# Patient Record
Sex: Female | Born: 1975 | Race: White | Hispanic: No | Marital: Married | Smoking: Never smoker
Health system: Southern US, Community
[De-identification: ages and names within clinical notes are randomized; demographics above are authoritative.]

## PROBLEM LIST (undated history)

## (undated) DIAGNOSIS — N200 Calculus of kidney: Secondary | ICD-10-CM

## (undated) DIAGNOSIS — K580 Irritable bowel syndrome with diarrhea: Secondary | ICD-10-CM

## (undated) DIAGNOSIS — E119 Type 2 diabetes mellitus without complications: Secondary | ICD-10-CM

## (undated) DIAGNOSIS — R10813 Right lower quadrant abdominal tenderness: Secondary | ICD-10-CM

## (undated) DIAGNOSIS — R3129 Other microscopic hematuria: Secondary | ICD-10-CM

## (undated) DIAGNOSIS — I10 Essential (primary) hypertension: Secondary | ICD-10-CM

## (undated) DIAGNOSIS — E782 Mixed hyperlipidemia: Secondary | ICD-10-CM

## (undated) HISTORY — DX: Irritable bowel syndrome with diarrhea: K58.0

## (undated) HISTORY — DX: Mixed hyperlipidemia: E78.2

## (undated) HISTORY — DX: Other microscopic hematuria: R31.29

## (undated) HISTORY — DX: Right lower quadrant abdominal tenderness: R10.813

## (undated) HISTORY — DX: Calculus of kidney: N20.0

## (undated) HISTORY — DX: Essential (primary) hypertension: I10

## (undated) HISTORY — DX: Type 2 diabetes mellitus without complications: E11.9

---

## 1997-12-13 ENCOUNTER — Emergency Department (HOSPITAL_COMMUNITY): Admission: EM | Admit: 1997-12-13 | Discharge: 1997-12-13 | Payer: Self-pay | Admitting: Emergency Medicine

## 1998-02-14 ENCOUNTER — Emergency Department (HOSPITAL_COMMUNITY): Admission: EM | Admit: 1998-02-14 | Discharge: 1998-02-14 | Payer: Self-pay | Admitting: Emergency Medicine

## 1998-05-05 ENCOUNTER — Emergency Department (HOSPITAL_COMMUNITY): Admission: EM | Admit: 1998-05-05 | Discharge: 1998-05-05 | Payer: Self-pay | Admitting: Emergency Medicine

## 1998-05-05 ENCOUNTER — Encounter: Payer: Self-pay | Admitting: Emergency Medicine

## 2000-07-11 ENCOUNTER — Emergency Department (HOSPITAL_COMMUNITY): Admission: EM | Admit: 2000-07-11 | Discharge: 2000-07-11 | Payer: Self-pay | Admitting: Emergency Medicine

## 2002-05-24 ENCOUNTER — Other Ambulatory Visit: Admission: RE | Admit: 2002-05-24 | Discharge: 2002-05-24 | Payer: Self-pay | Admitting: Gynecology

## 2002-07-29 ENCOUNTER — Encounter: Payer: Self-pay | Admitting: Urology

## 2002-07-29 ENCOUNTER — Ambulatory Visit (HOSPITAL_COMMUNITY): Admission: RE | Admit: 2002-07-29 | Discharge: 2002-07-29 | Payer: Self-pay | Admitting: Urology

## 2003-05-11 ENCOUNTER — Inpatient Hospital Stay (HOSPITAL_COMMUNITY): Admission: AD | Admit: 2003-05-11 | Discharge: 2003-05-11 | Payer: Self-pay | Admitting: Gynecology

## 2003-06-03 ENCOUNTER — Other Ambulatory Visit: Admission: RE | Admit: 2003-06-03 | Discharge: 2003-06-03 | Payer: Self-pay | Admitting: Gynecology

## 2003-09-09 ENCOUNTER — Encounter: Admission: RE | Admit: 2003-09-09 | Discharge: 2003-12-08 | Payer: Self-pay | Admitting: Gynecology

## 2003-11-04 ENCOUNTER — Inpatient Hospital Stay (HOSPITAL_COMMUNITY): Admission: AD | Admit: 2003-11-04 | Discharge: 2003-11-04 | Payer: Self-pay | Admitting: Gynecology

## 2003-12-16 ENCOUNTER — Encounter (INDEPENDENT_AMBULATORY_CARE_PROVIDER_SITE_OTHER): Payer: Self-pay | Admitting: Specialist

## 2003-12-16 ENCOUNTER — Inpatient Hospital Stay (HOSPITAL_COMMUNITY): Admission: RE | Admit: 2003-12-16 | Discharge: 2003-12-19 | Payer: Self-pay | Admitting: Gynecology

## 2004-01-30 ENCOUNTER — Other Ambulatory Visit: Admission: RE | Admit: 2004-01-30 | Discharge: 2004-01-30 | Payer: Self-pay | Admitting: Gynecology

## 2005-01-10 IMAGING — US US OB COMP LESS 14 WK
1 series · 18 of 28 positions shown · non-contrast
Comparison: none

CLINICAL DATA: Vaginal spotting at 8 weeks of pregnancy.
COMPLETE OBSTETRICAL AND TRANSVAGINAL OBSTETRICAL ULTRASOUND, 05/11/03
Transabdominal and transvaginal sonographic imaging of the pelvis demonstrates an intrauterine gestational sac containing a fetal pole and normal appearing yolk sac.  The mean fetal crown-rump length is 1.70 cm, giving an estimated gestational age of 8 weeks 2 days.  Normal fetal cardiac activity is demonstrated with a fetal heart rate of 167 beats per minute.  A 3.2 cm simple-appearing right ovarian cyst is noted.  Also noted is a 1.1 cm simple-appearing left ovarian cyst.  No free peritoneal fluid is seen.
IMPRESSION 
Single live intrauterine gestation with an estimated gestational age of 8 weeks 2 days.
No subchorionic hemorrhage demonstrated.
3.2 cm simple right ovarian cyst and 1.1 cm simple left ovarian cyst.

[Series 1: us ob comp<14 wk · 18 of 38 slices shown]
[im 1/38]
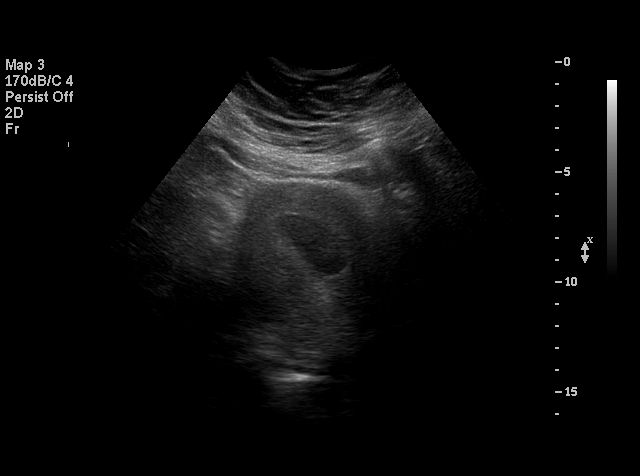
[im 3/38]
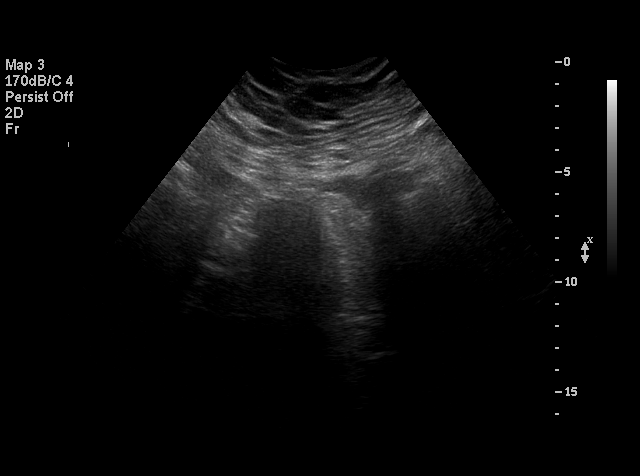
[im 5/38]
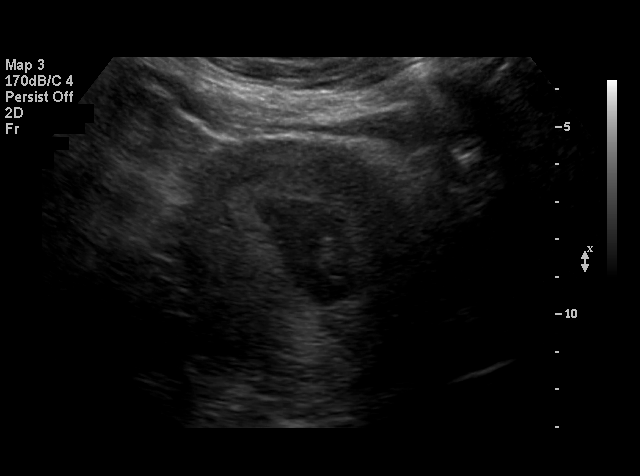
[im 7/38]
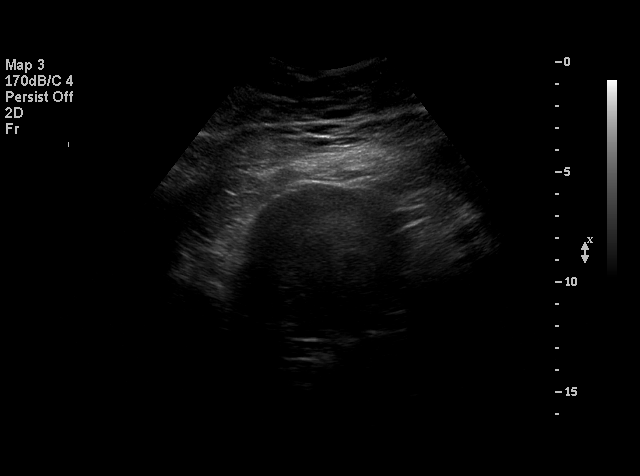
[im 10/38]
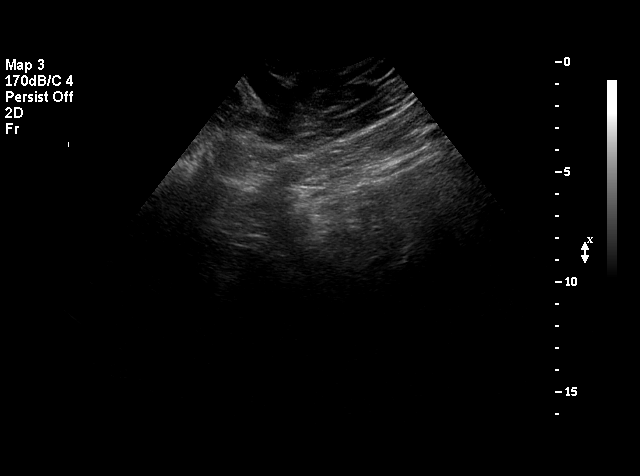
[im 11/38]
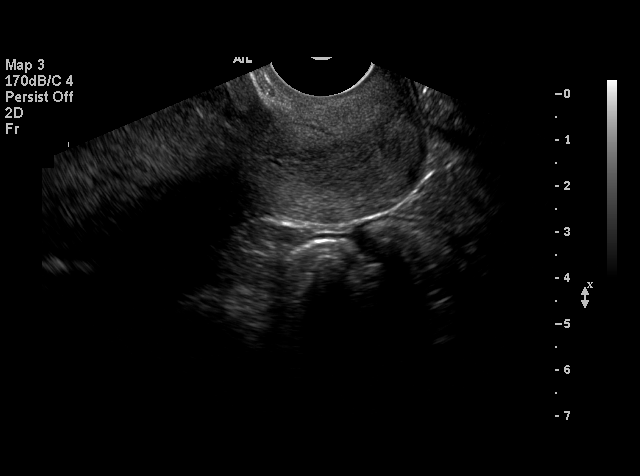
[im 14/38]
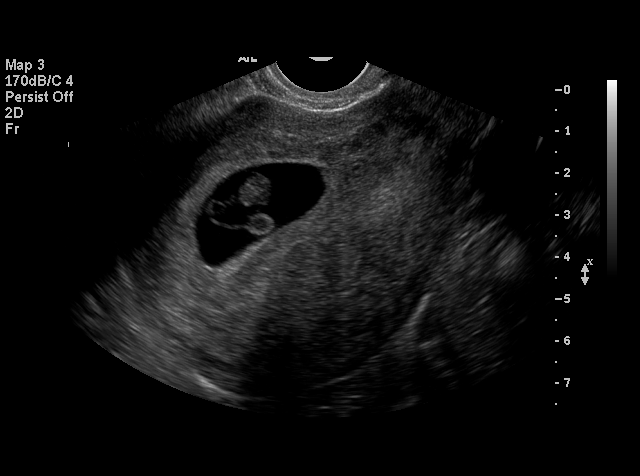
[im 16/38]
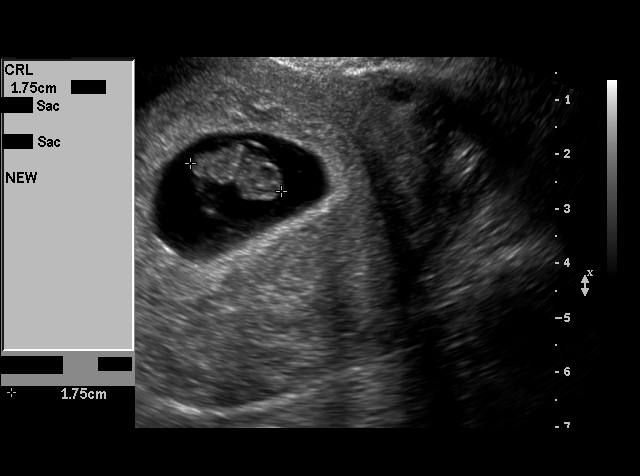
[im 18/38]
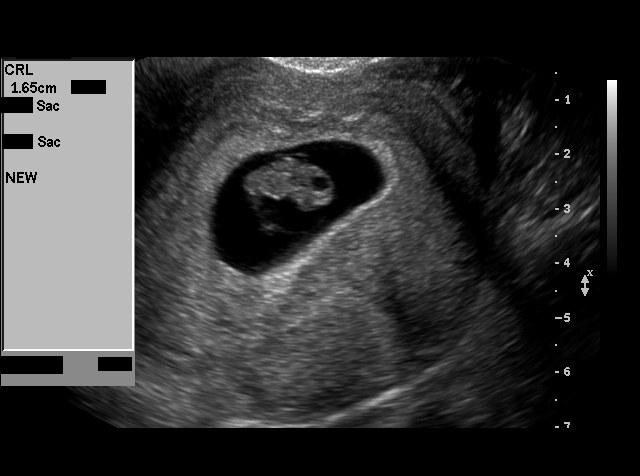
[im 20/38]
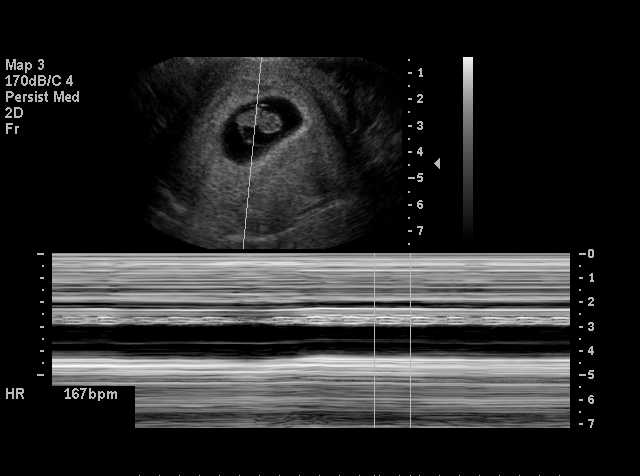
[im 22/38]
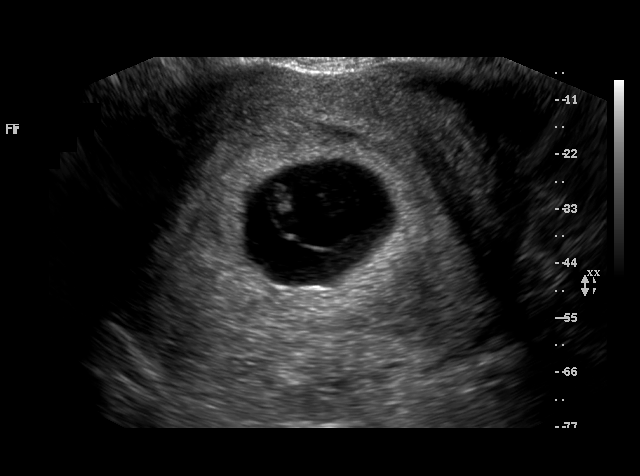
[im 24/38]
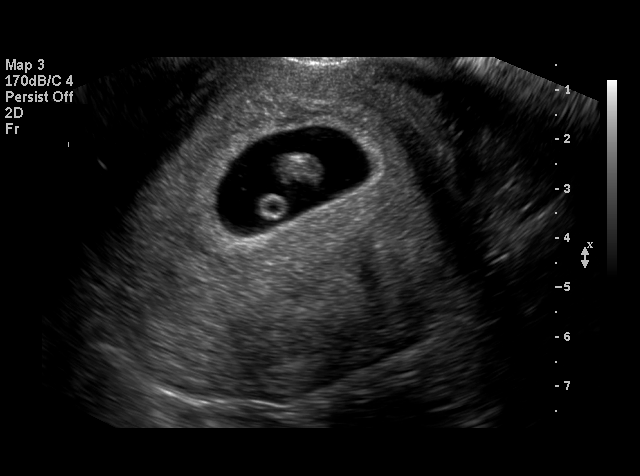
[im 27/38]
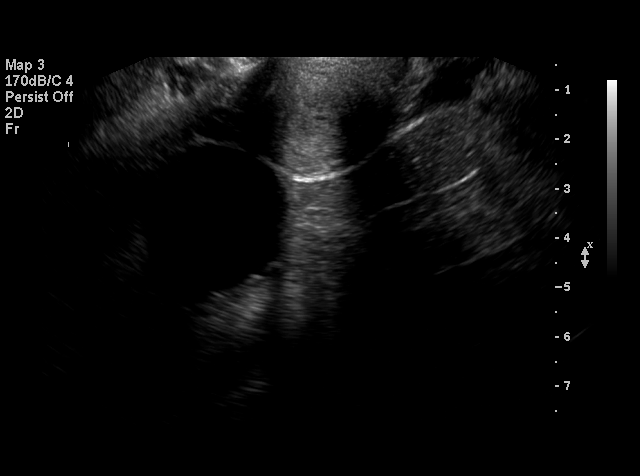
[im 29/38]
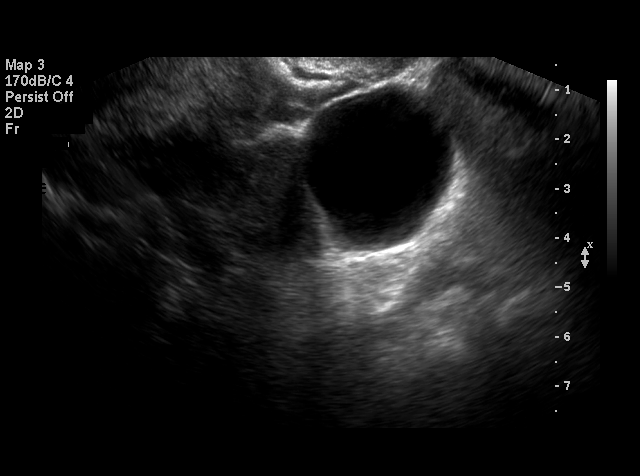
[im 31/38]
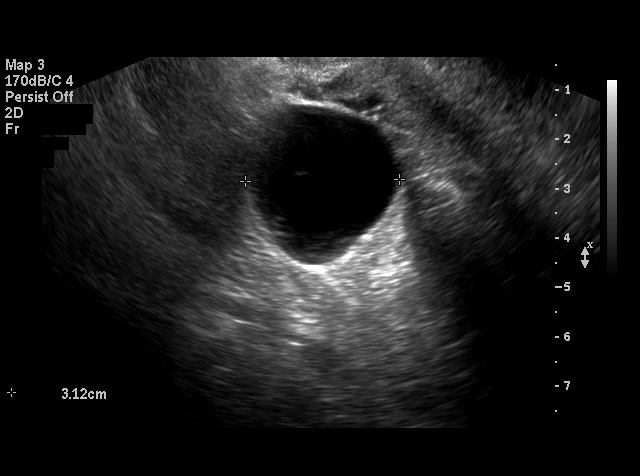
[im 33/38]
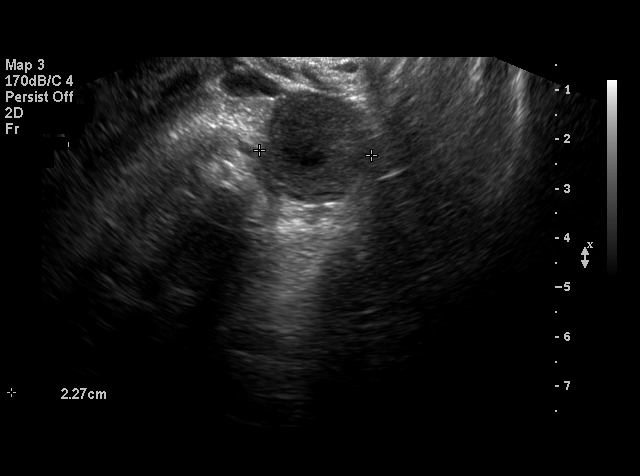
[im 35/38]
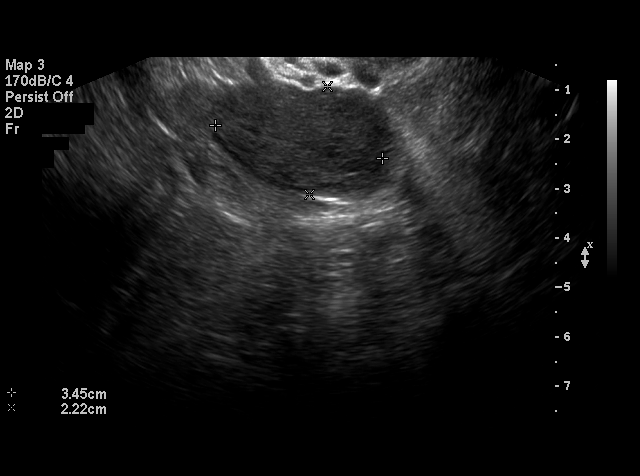
[im 38/38]
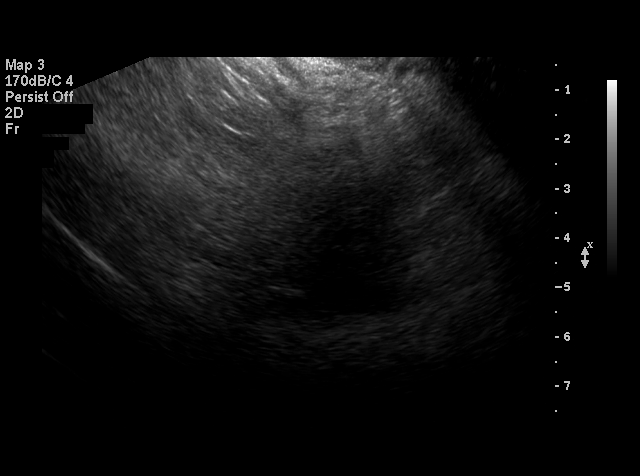

[18 of 28 positions shown; findings below may reference images not displayed]

## 2005-05-03 ENCOUNTER — Other Ambulatory Visit: Admission: RE | Admit: 2005-05-03 | Discharge: 2005-05-03 | Payer: Self-pay | Admitting: Gynecology

## 2005-07-06 IMAGING — US US RETROPERITONEAL COMPLETE
1 series · 18 of 25 positions shown · non-contrast
Comparison: None.

CLINICAL DATA: 33 weeks gestational age, history of urinary tract calculi, presenting with right flank pain. 
COMPLETE URINARY TRACT ULTRASOUND ? 11/04/2003

[Series 1: us renal/aorta · 18 of 32 slices shown]
[im 1/32]
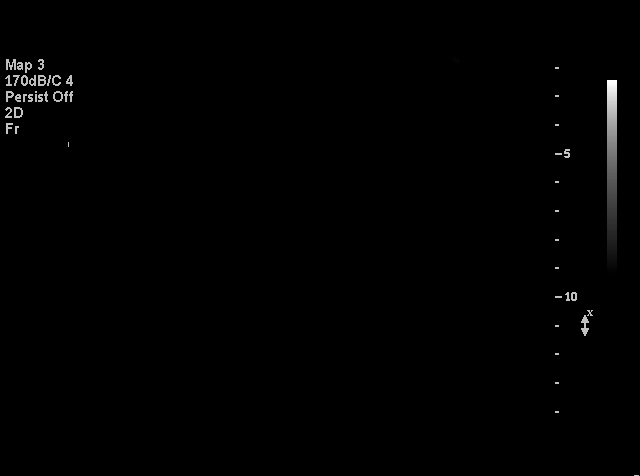
[im 3/32]
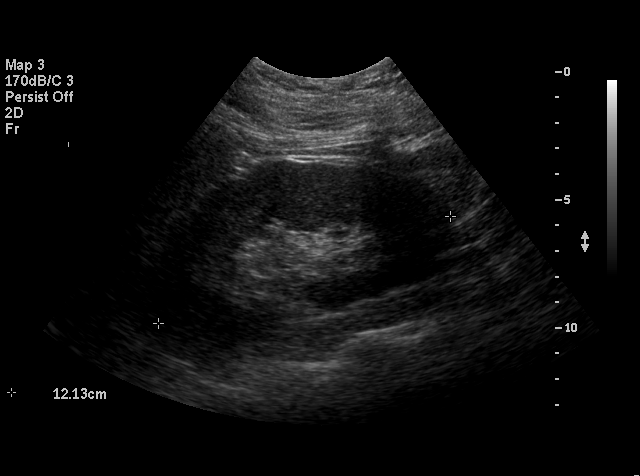
[im 4/32]
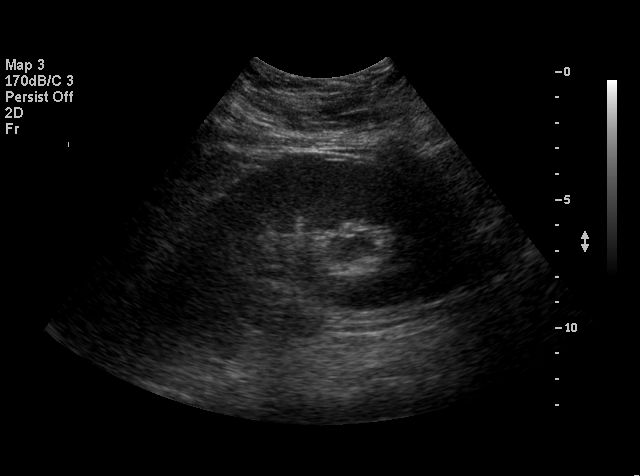
[im 6/32]
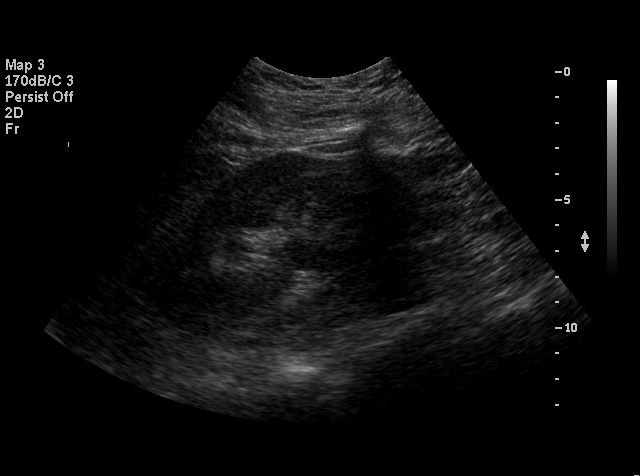
[im 8/32]
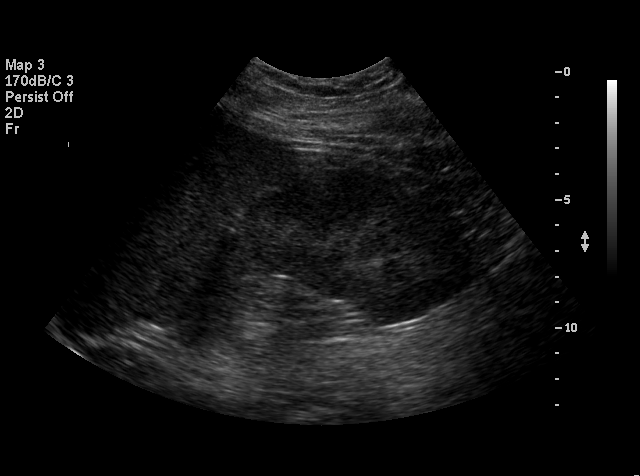
[im 10/32]
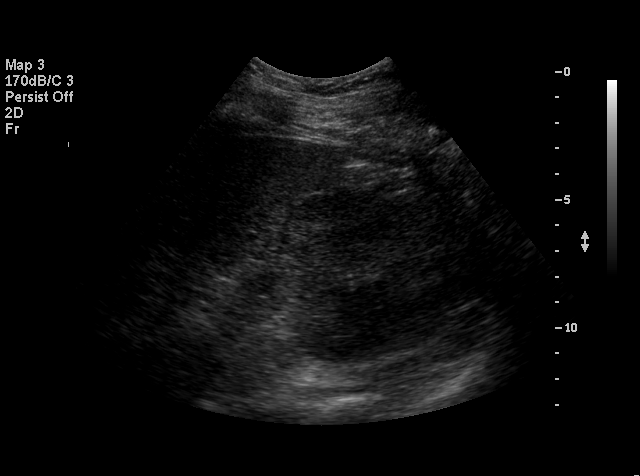
[im 12/32]
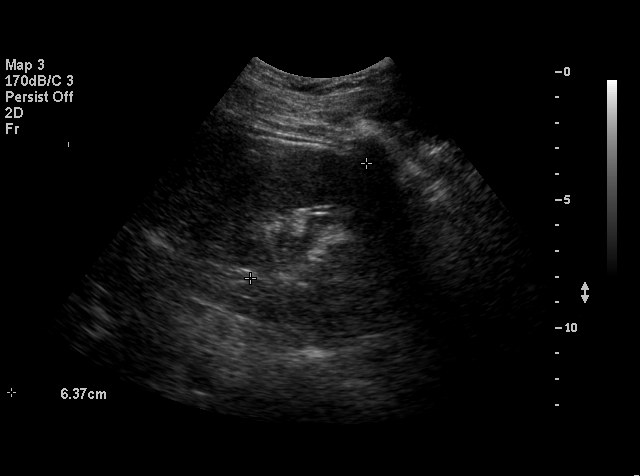
[im 13/32]
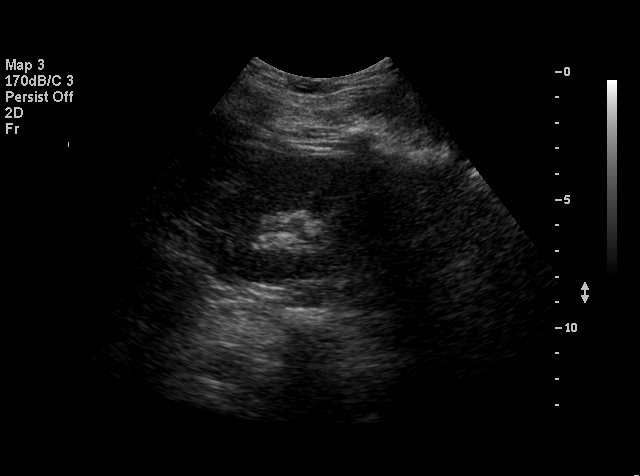
[im 15/32]
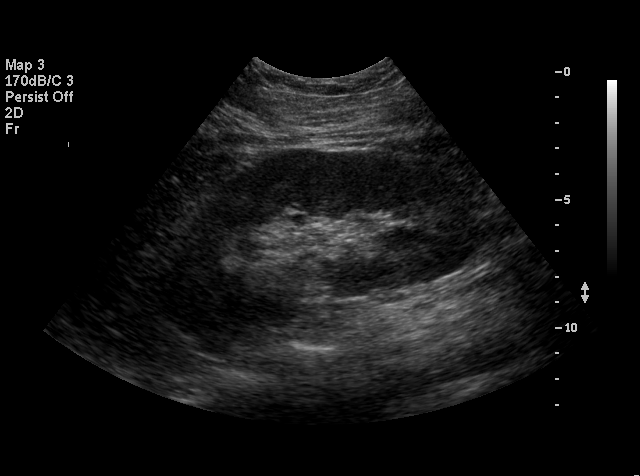
[im 17/32]
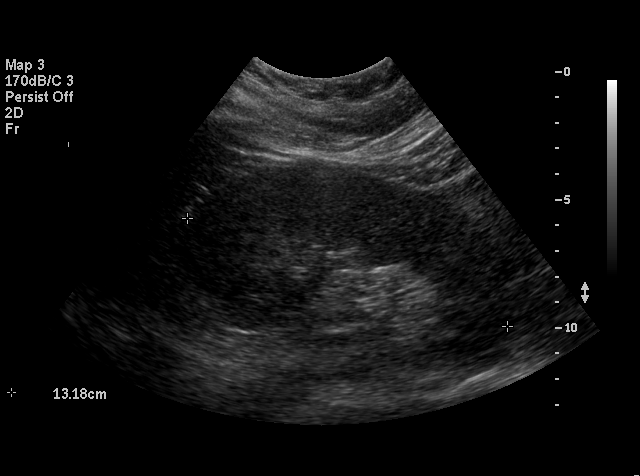
[im 19/32]
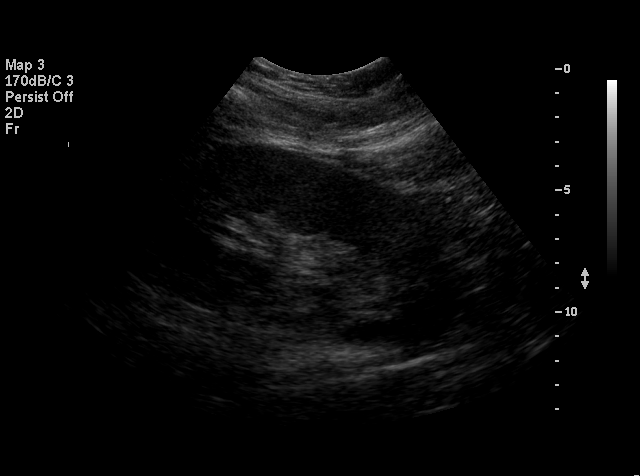
[im 20/32]
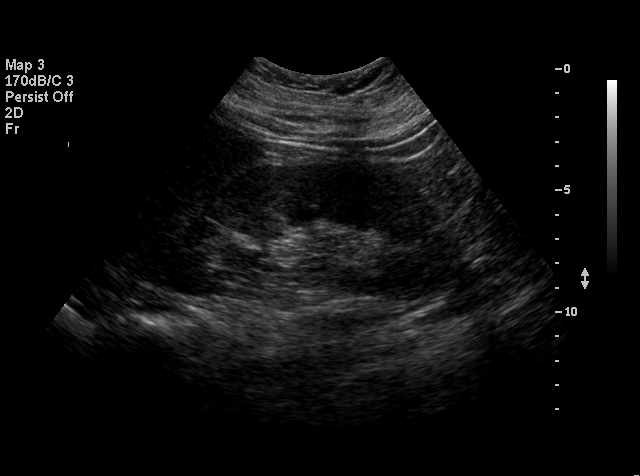
[im 22/32]
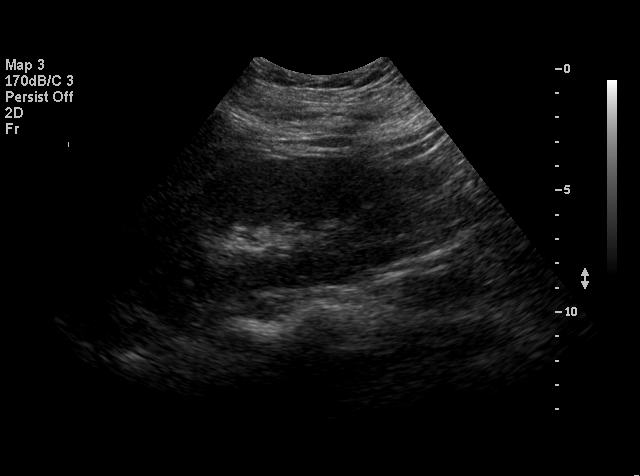
[im 24/32]
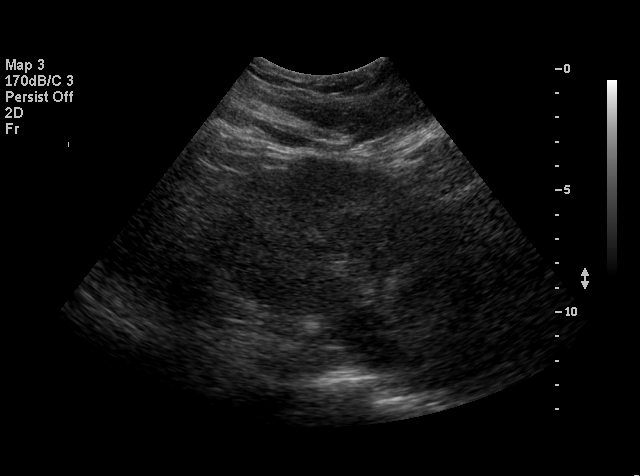
[im 26/32]
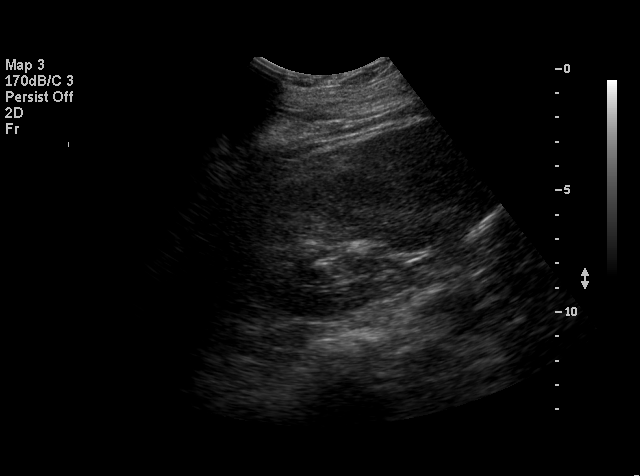
[im 28/32]
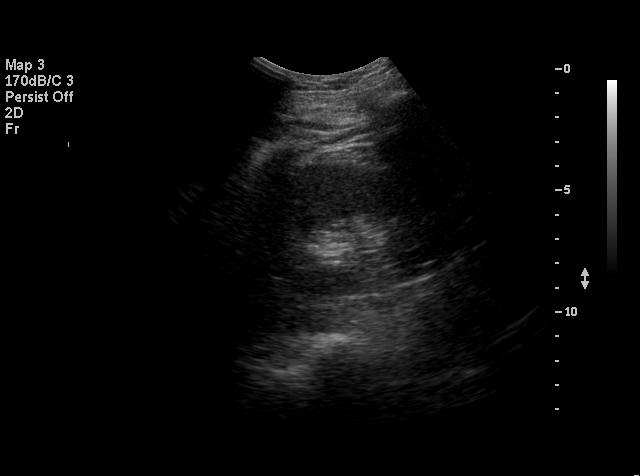
[im 29/32]
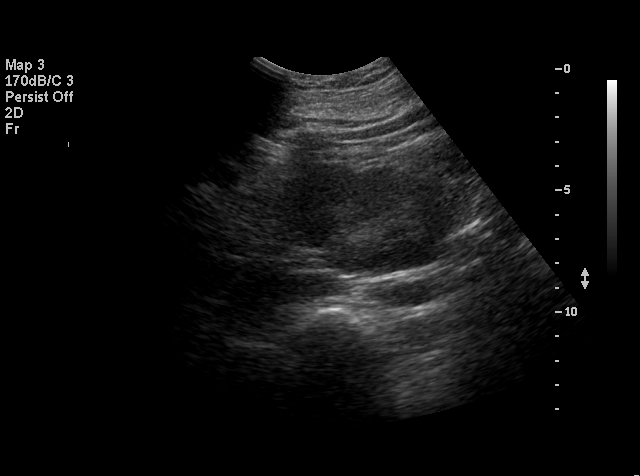
[im 32/32]
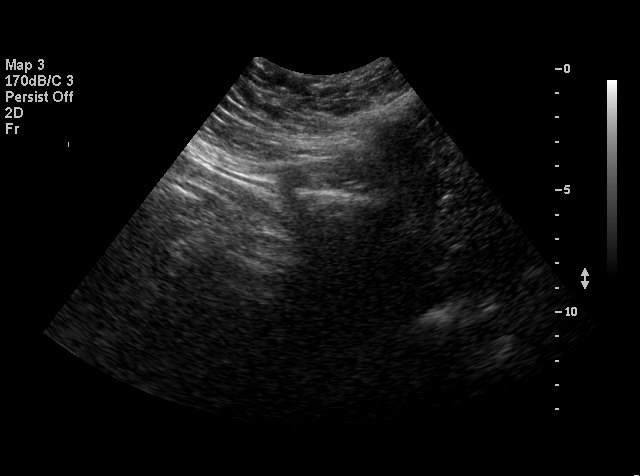

[18 of 25 positions shown; findings below may reference images not displayed]

Both kidneys are normal in size, the right kidney measuring approximately 13.2 cm and the left approximately 12.1 cm.  There is no evidence of hydronephrosis involving either kidney.  No shadowing calculi are identified.  Cortical thickness is normal bilaterally.  No focal renal parenchymal abnormalities are identified.  The bladder was emptied prior to imaging and was therefore not evaluated.  
IMPRESSION
Normal urinary tract ultrasound.

## 2006-01-24 ENCOUNTER — Other Ambulatory Visit: Admission: RE | Admit: 2006-01-24 | Discharge: 2006-01-24 | Payer: Self-pay | Admitting: Gynecology

## 2007-09-04 ENCOUNTER — Other Ambulatory Visit: Admission: RE | Admit: 2007-09-04 | Discharge: 2007-09-04 | Payer: Self-pay | Admitting: Gynecology

## 2008-01-17 ENCOUNTER — Encounter: Payer: Self-pay | Admitting: Gynecology

## 2008-01-17 ENCOUNTER — Ambulatory Visit (HOSPITAL_BASED_OUTPATIENT_CLINIC_OR_DEPARTMENT_OTHER): Admission: RE | Admit: 2008-01-17 | Discharge: 2008-01-17 | Payer: Self-pay | Admitting: Gynecology

## 2010-11-23 NOTE — H&P (Signed)
NAME:  Monique, [name]               ACCOUNT NO.:  [id number]   MEDICAL RECORD NO.:  [id number]          PATIENT TYPE:  AMB   LOCATION:  [facility]                         FACILITY:  [facility]   PHYSICIAN:  Timothy P. Fontaine, M.D.DATE OF BIRTH:  1976/06/23   DATE OF ADMISSION:  DATE OF DISCHARGE:                              HISTORY & PHYSICAL   [name] at 8:45.   CHIEF COMPLAINT:  Pelvic pain, cystic right pelvic mass.   HISTORY OF PRESENT ILLNESS:  A 35 year old G2, P2 female, oral  contraceptive birth control presents with a history of persistent pelvic  pain.  The patient notes initially on-and-off right-sided pelvic pain  which now occurs on a daily basis worsens with her menses with constant  deep dyspareunia with every coital episode.  Ultrasound does show a  persistent simple cystic mass measuring 33 mm, negative color-flow which  has persisted over the past 6 months; and she is admitted, at this time,  for laparoscopic assessment removal of this cystic area.   PAST MEDICAL HISTORY:  1. History of type 2 diabetes being followed with diet  2. Past history of hypertension, prior treatment, now on observation.   PAST SURGICAL HISTORY:  Cesarean section x2.   CURRENT MEDICATIONS:  Oral contraceptives.   REVIEW OF SYSTEMS:  Noncontributory.   FAMILY HISTORY:  Noncontributory.   SOCIAL HISTORY:  Noncontributory.   ADMISSION PHYSICAL EXAM:  VITAL SIGNS:  Afebrile vital signs are stable  with blood pressure 140/80.  HEENT: Normal.  LUNGS:  Clear.  CARDIAC:  Regular rate.  No rubs, murmurs, or gallops.  ABDOMINAL EXAM:  Benign.  PELVIC:  Bimanual without masses or tenderness.   ASSESSMENT:  A 35 year old G2, P2 female oral contraceptives persistent  right-sided pain progressively getting worse on a daily basis with deep  dyspareunia worsens with menses.  Ultrasound shows a small cystic area  in the adnexa questionable ovarian versus  paratubal, negative color-  flow, for laparoscopy and removal.  We have been following this over the  past 6 months or so.  It has remained stable in size, but her pain seems  to be progressively getting worse.  Does not have overt GI or GU  associated symptoms.  Risks, benefits, indications, and alternatives for  management were reviewed, and she wants to proceed with laparoscopy.   I reviewed the proposed surgery, the expected intraoperative  postoperative courses; to include general anesthesia, multiple port  sites, insufflation, trocar placement, use of sharp blunt dissection,  electrocautery, laser, harmonic scalpel all reviewed with her.  No  guarantees as far as pain relief were made.  She understands that her  pain may continue, worsen, or change following the procedure.  She  understands that we may find no pathology or may find pathology that I  will not be able to treat, and will leave her with pathology that would  be felt unsafe to proceed with definitive therapy at this time.   The issues of carcinoma have been discussed, understands this  possibility, and the issues of laparoscopic versus laparotomy  reviewed,  possible worsening prognosis if ruptured.  The acute risks to include  infection, both incisional as well as internal, requiring opening and  draining of incisions; closure by secondary intention; prolonged  antibiotics, all reviewed.  The risks of hemorrhage necessitating  transfusion and the risks of transfusion discussed to include  transfusion reaction, hepatitis, HIV, mad cow disease, and other unknown  entities.  The risk of inadvertent injury to internal organs either  immediately recognized or delay recognized including bowel, bladder,  ureters, vessels, and nerves necessitating major exploratory reparative  surgeries, future reparative surgeries, ostomy formation, bowel  resection, bladder repair, ureteral surgery; all discussed, understood,  and accepted.   The patient's questions were answered to her  satisfaction.  She is ready to proceed with surgery.      Timothy P. Fontaine, M.D.  Electronically Signed     TPF/MEDQ  D:  01/14/2008  T:  01/14/2008  Job:  [id number]

## 2010-11-23 NOTE — Op Note (Signed)
NAME:  Monique [name], Monique [name]               ACCOUNT NO.:  [id number]   MEDICAL RECORD NO.:  [id number]          PATIENT TYPE:  AMB   LOCATION:  [facility]                         FACILITY:  [facility]   PHYSICIAN:  Timothy P. Fontaine, M.D.DATE OF BIRTH:  [birth year]   DATE OF PROCEDURE:  01/17/2008  DATE OF DISCHARGE:                               OPERATIVE REPORT   PREOPERATIVE DIAGNOSES:  Pelvic pain, right adnexal cystic mass.   POSTOPERATIVE DIAGNOSES:  Right paratubal cyst, periumbilical adhesions,  left lower quadrant skin boil, leiomyoma.   PROCEDURE:  Laparoscopic excision right paratubal cyst, lysis of  adhesions, I&D left lower quadrant skin boil.   SURGEON:  Timothy P. Fontaine, M.D.   ANESTHETIC:  General.   SPECIMENS:  1. Pelvic washing.  2. Right paratubal cyst.   COMPLICATIONS:  None.   ESTIMATED BLOOD LOSS:  Minimal.   FINDINGS:  EUA:  A 2-cm fluctuant left lower quadrant skin boil,  somewhat pointing in the panniculus.  Pelvic:  External BUS, vagina  normal.  Cervix normal.  Bimanual:  Uterus grossly normal in size.  Adnexa without gross masses.  Laparoscopic:  Anterior cul-de-sac with  minimal vesicouterine peritoneal scarring from prior cesarean sections,  postoperative posterior cul-de-sac normal, uterus normal size, shape and  contour with small left fundal subserosal myoma.  Right and left  fallopian tubes normal length, caliber, fimbriated ends.  Right and left  ovaries grossly normal, free and mobile.  A right 3-cm paratubal cyst  within the mesosalpinx ultimately removed intact.  Fallopian tube intact  postoperatively.  Upper abdominal exam with veil of thick omental  adhesions to the periumbilical region all lysed by the end of the case.  Appendix not visualized.  Liver smooth, no abnormalities.  Gallbladder  not visualized.  No upper abdominal adhesive disease noted.   PROCEDURE:  The patient was taken to the operating room, underwent  general anesthesia, was  placed in the low dorsal lithotomy position.  Received an abdominal, perineal, vaginal preparation with Betadine  solution.  EUA performed.  Bladder emptied with indwelling Foley  catheter.  A Hulka tenaculum was then placed through the cervix and the  patient was draped in the usual fashion.  During this process a 2-cm  fluctuant left lower quadrant boil was noted in the panniculus.  A  transverse infraumbilical incision was made and using the Optiview 10-mm  trocar, the abdomen was directly entered without difficulty,  subsequently insufflated.  A thick veil of omental adhesions was noted  in the periumbilical region.  Right and left 5-mm suprapubic ports were  then placed under direct visualization after transillumination of the  vessels without difficulty.  The pelvic organs, upper abdominal exam was  carried out with findings as noted above.  Pelvic washings were obtained  and sent to pathology.  Subsequently the right adnexa was elevated and  stabilized using unipolar scissors.  An incision was made in the  mesosalpinx overlying the paratubal cyst and through blunt dissection  the cyst was delivered from the mesosalpinx without difficulty.  The  remaining vascular strands were cauterized and the cyst was removed.  During this process, of note, the fallopian tube was left undisturbed  and undamaged.  There was good spontaneous reapproximation of the  mesosalpinx, requiring no closure.  A 5-mm laparoscope was then placed  through the suprapubic port and a Endopouch was placed through the  infraumbilical port and the paratubal cyst was retrieved intact through  the infraumbilical port.  At this point the 10-mm laparoscopic sheath  was re-placed to the abdomen.  The abdomen was reinsufflated and using  the suprapubic 5-mm laparoscope, the periumbilical adhesions were  visualized and ultimately using the Harmonic scalpel were all reduced  without difficulty.  At this point the pelvis was  copiously irrigated.  All surgical sites were reinspected initially under a high pressure and  then low pressure situation, showing adequate hemostasis.  The 5-mm  ports were removed, the gas slowly allowed to escape.  Again, adequate  hemostasis visualized and the infraumbilical port was removed under  direct visualization, showing adequate hemostasis and no evidence of  hernia formation.  All skin incisions were injected using 0.25%  Marcaine.  A 0 Vicryl infraumbilical subcutaneous fascial stitch was  placed and all skin incisions closed using Dermabond skin adhesive.  Of  note, during the entire procedure the boil area remained covered with  the drape and was well away from the surgical sites.  At this point the  area was recleansed with Betadine and was lanced with a scalpel and  purulent material was extruded.  The site again was washed with Betadine  and a sterile dressing was applied to this area.  The Hulka tenaculum  was removed.  The patient was placed in the supine position, awakened  without difficulty, and taken to recovery in good condition, having  tolerated the procedure well.      Timothy P. Fontaine, M.D.  Electronically Signed     TPF/MEDQ  D:  01/17/2008  T:  01/17/2008  Job:  [id number]

## 2010-11-26 NOTE — H&P (Signed)
NAME:  Monique [name], Monique [name]                         ACCOUNT NO.:  [id number]   MEDICAL RECORD NO.:  [id number]                   PATIENT TYPE:  INP   LOCATION:  NA                                   FACILITY:  [facility]   PHYSICIAN:  Timothy P. Fontaine, M.D.           DATE OF BIRTH:  [birth year]   DATE OF ADMISSION:  12/16/2003  DATE OF DISCHARGE:                                HISTORY & PHYSICAL   CHIEF COMPLAINT:  1. Pregnancy at term.  2. Gestational diabetes, insulin dependent.  3. Prior cesarean section desires repeat cesarean section.   HISTORY OF PRESENT ILLNESS:  A 35 year old G37, P21 female at [redacted] weeks  gestation being followed with a history of gestational diabetes. The patient  is requiring NPH insulin at bedtime and a sliding p.r.n. scale during the  day to control her glucose, which have been under good control. She is being  followed with antepartum testing to include fluid levels and NST testing,  all of which have been normal. She is admitted at this time for a repeat  cesarean section after counseling for a trial of labor, which she rejects.  For the remainder of her history, see her [facility].   PHYSICAL EXAMINATION:  HEENT:  Normal.  LUNGS:  Clear.  CARDIAC:  Regular rate without murmur, rub, or gallop.  ABDOMEN:  Gravid, vertex, consistent with term. Positive __________ tone.  PELVIC:  Not performed.   ASSESSMENT:  A 35 year old G46, P19 female at term gestation. Insulin  dependent gestational diabetes, in good control. For repeat cesarean  section. The risks, benefits, indications and alternatives for the procedure  were reviewed to include trial of labor versus repeat cesarean section. She  desires repeat cesarean section. The intra-operative, post-operative courses  were explained to include the acute risks including bleeding, transfusion,  infection, prolonged antibiotics, abscess formation requiring re-operation  and drainage, wound complications requiring opening and  draining of her  wound, closure by secondary intention. The increased risk of wound  complications particularly given her weight and diabetes was discussed and  she understands and accepts this. The risks of inadvertant injury to  internal organs including bowel, bladders, ureters, vessels and nerves  necessitating major exploratory reparative surgeries and future reparative  surgeries was all discussed, understood and accepted. The risk of fetal  injury during the birthing process was also reviewed and she understands and  accepts this. The patient's questions were answered to her satisfaction and  she is ready to proceed with the surgery.                                               Timothy P. Audie Box, M.D.    TPF/MEDQ  D:  12/12/2003  T:  12/13/2003  Job:  [id number]

## 2010-11-26 NOTE — Discharge Summary (Signed)
NAME:  Monique [name], Monique [name]                         ACCOUNT NO.:  [id number]   MEDICAL RECORD NO.:  [id number]                   PATIENT TYPE:  INP   LOCATION:  9102                                 FACILITY:  [facility]   PHYSICIAN:  Timothy P. Fontaine, M.D.           DATE OF BIRTH:  January 26, 1976   DATE OF ADMISSION:  12/16/2003  DATE OF DISCHARGE:  12/19/2003                                 DISCHARGE SUMMARY   DISCHARGE DIAGNOSES:  1. Pregnancy at term.  2. Prior cesarean section desires repeat cesarean section.  3. Gestational diabetes, insulin dependent.   PROCEDURE:  Repeat low transverse cervical cesarean section December 16, 2003.   HOSPITAL COURSE:  The patient was admitted for a repeat cesarean section  December 16, 2003.  She underwent an uncomplicated repeat cesarean section  producing a normal female Apgars 9 and 9, weight 7 pounds 6 ounces at 1314  p.m.  The patient's postoperative course was uncomplicated.  She had a JP  drain for subcutaneous drainage which was removed on postop day #2.  Her  postop hemoglobin was 9.1.  The patient received precautions, instructions,  and follow up.  Will be seen in the office in 6 weeks.  Received a  prescription for Tylox, dispense #20, one to two p.o. q.4-6 h.                                               Timothy P. Audie Box, M.D.    TPF/MEDQ  D:  12/19/2003  T:  12/20/2003  Job:  [id number]

## 2010-11-26 NOTE — H&P (Signed)
NAME:  [facility] [name], Monique [name]                           ACCOUNT NO.:  [id number]   MEDICAL RECORD NO.:  [id number]                   PATIENT TYPE:  MAT   LOCATION:  [facility]                                 FACILITY:  [facility]   PHYSICIAN:  Juan H. Lily Peer, M.D.             DATE OF BIRTH:  08-Jul-1976   DATE OF ADMISSION:  11/04/2003  DATE OF DISCHARGE:                                HISTORY & PHYSICAL   ADDENDUM   The patient had a reassuring fetal heart rate tracing with no evidence of  uterine contraction.                                               Juan H. Lily Peer, M.D.    JHF/MEDQ  D:  11/04/2003  T:  11/04/2003  Job:  [id number]

## 2010-11-26 NOTE — Consult Note (Signed)
NAME:  Monique [name], Monique [name]                           ACCOUNT NO.:  [id number]   MEDICAL RECORD NO.:  [id number]                   PATIENT TYPE:  MAT   LOCATION:  [facility]                                 FACILITY:  [facility]   PHYSICIAN:  Juan H. Lily Peer, M.D.             DATE OF BIRTH:  February 20, 1976   DATE OF CONSULTATION:  11/04/2003  DATE OF DISCHARGE:                                   CONSULTATION   HISTORY OF PRESENT ILLNESS:  The patient is a 35 year old gravida 2, para 1,  currently at [redacted] weeks gestation who presented to [facility] this  morning complaining of acute onset of right flank pain which started  yesterday, dull, and worse this morning.  The patient stated that since the  age of 79, she has had on and off history of kidney stones.  During this  pregnancy, she was diagnosed for gestational diabetes for which she is  currently on NPH insulin consisting of 44 units subcu q.h.s.   PHYSICAL EXAMINATION:  VITAL SIGNS:  Temperature 98.1, pulse 76,  respirations 18, blood pressure 132/64.   LABORATORY DATA:  Comprehensive metabolic panel; blood sugar slightly  elevated at 120, but the rest of the parameters were normal.  CBC; white  blood cell count 7.2, hemoglobin and hematocrit of 12.1 and 36.0,  respectively.  Platelet count 273,000.  Urinalysis was essentially  unremarkable.  She had a renal ultrasound which was reported to be  completely negative.  She was given 50 mg of Demerol and 12.5 mg of  Phenergan when these studies were completed.  When she was examined, she was  in no acute distress.  She said her pain had resolved and it just felt like  a dull sensation.   LUNGS:  Clear to auscultation without any rhonchi or wheezes.  HEART:  Regular rate and rhythm with no murmurs or gallops.  ABDOMEN:  Soft and nontender.  There was no CVA tenderness.  PELVIC:  Cervix long, closed, and posterior.  EXTREMITIES:  DTR 1+, negative clonus.  There was no psoas, obturator, or  heel tap  sign.   Based on these findings, she will be released home with a strainer and her  urine will be sent for urine culture.  She has a follow-up appointment on  Thursday for NST and ultrasound.  Will follow then.  The patient was  instructed to return back to the emergency room or to the office if  increased flank tenderness or fever.  She did not require anything for pain,  although, I will give her a prescription for Demerol 50 mg with 12.5 mg of  Phenergan three tablets, so she may take one every six hours if needed to  increase her fluid intake and to use her urinary strainer.  Juan H. Lily Peer, M.D.    JHF/MEDQ  D:  11/04/2003  T:  11/04/2003  Job:  [id number]

## 2010-11-26 NOTE — Op Note (Signed)
NAME:  Monique [name], Monique [name]                         ACCOUNT NO.:  [id number]   MEDICAL RECORD NO.:  [id number]                   PATIENT TYPE:  INP   LOCATION:  9102                                 FACILITY:  [facility]   PHYSICIAN:  Timothy P. Fontaine, M.D.           DATE OF BIRTH:  [birth year]   DATE OF PROCEDURE:  12/16/2003  DATE OF DISCHARGE:                                 OPERATIVE REPORT   PREOPERATIVE DIAGNOSES:  Term pregnancy, gestational diabetes insulin  dependent, prior cesarean section, desires repeat cesarean section.   POSTOPERATIVE DIAGNOSES:  Term pregnancy, gestational diabetes insulin  dependent, prior cesarean section, desires repeat cesarean section.   PROCEDURE:  Repeat low transverse cervical cesarean section.   SURGEON:  Timothy P. Fontaine, M.D.   ASSISTANTGaetano Hawthorne. Lily Peer, M.D.   ANESTHESIA:  Spinal.   ESTIMATED BLOOD LOSS:  Less than 500 mL.   COMPLICATIONS:  None.   SPECIMENS:  1. Samples of cord blood.  2. Placenta.   FINDINGS:  A 1313 normal female, Apgar 9 & 9, nuchal cord x2 tight noted.  Pelvic anatomy noted to be normal.   DESCRIPTION OF PROCEDURE:  The patient underwent spinal anesthesia and was  placed in the left tilt supine position receiving abdominal preparation with  Betadine scrub and Betadine solution. A Foley catheter was placed in sterile  technique per nursing personnel. The patient was draped in the usual fashion  and after assuring adequate anesthesia, the abdomen was sharply entered  through a repeat Pfannenstiel incision achieving adequate hemostasis at all  levels. The bladder flap was sharply and bluntly developed without  difficulty.  The uterus was sharply entered in the lower uterine segment and  bluntly extended laterally.  The membranes were ruptured, fluid noted to be  clear. The infant's head delivered through the incision, nares and mouth  suctioned, a nuchal cord x2 tight was reduced, the rest of the infant  delivered, the cord doubly clamped and cut and the infant handed to  pediatrics in attendance.  Samples of cord blood were obtained, placenta was  then spontaneously extruded, noted to be intact and was sent to pathology.  The uterus was exteriorized, the endometrial cavity explored with a sponge  to remove all placental and membrane fragments. The patient received 1 g  Ancef antibiotic prophylaxis at this time.  The uterine incision was then  closed in one layer using #0 Vicryl suture in a running interlocking stitch  and subsequently several figure-of-eight interrupted sutures were placed  along the incision line to achieve ultimate hemostasis. The uterus was  returned to the abdomen which was copiously irrigated, adequate hemostasis  visualized, the fascia reapproximated using #0 Vicryl and a running stitch  starting at the angle meeting in the middle. The subcutaneous tissues were  irrigated, hemostasis achieved with electrocautery.  A Jackson-Pratt drain  was placed through a separate stab incision into the subcutaneous fat.  The  fat layer was reapproximated using 3-0 plain suture in interrupted fashion.  The skin was then reapproximated using 4-0 Vicryl in a running subcuticular  stitch, Benzoin and Steri-Strips applied.  The Jackson-Pratt drain secured  with a silk suture and the suction device supplied. A sterile dressing was  applied and the patient was taken to the recovery room in good condition  having tolerated the procedure well.                                               Timothy P. Audie Box, M.D.    TPF/MEDQ  D:  12/16/2003  T:  12/17/2003  Job:  [id number]

## 2015-05-28 ENCOUNTER — Other Ambulatory Visit: Payer: Self-pay

## 2015-09-09 ENCOUNTER — Other Ambulatory Visit: Payer: Self-pay | Admitting: Allergy and Immunology

## 2019-10-20 ENCOUNTER — Other Ambulatory Visit: Payer: Self-pay | Admitting: Physician Assistant

## 2019-11-13 ENCOUNTER — Ambulatory Visit: Payer: Self-pay | Admitting: Physician Assistant

## 2019-11-18 ENCOUNTER — Other Ambulatory Visit: Payer: Self-pay | Admitting: Physician Assistant

## 2019-12-05 ENCOUNTER — Encounter: Payer: Self-pay | Admitting: Physician Assistant

## 2019-12-05 ENCOUNTER — Other Ambulatory Visit: Payer: Self-pay

## 2019-12-05 ENCOUNTER — Ambulatory Visit: Payer: BC Managed Care – PPO | Admitting: Physician Assistant

## 2019-12-05 VITALS — BP 132/68 | HR 78 | Temp 97.8°F | Ht 72.0 in | Wt 230.0 lb

## 2019-12-05 DIAGNOSIS — E782 Mixed hyperlipidemia: Secondary | ICD-10-CM | POA: Insufficient documentation

## 2019-12-05 DIAGNOSIS — E119 Type 2 diabetes mellitus without complications: Secondary | ICD-10-CM | POA: Diagnosis not present

## 2019-12-05 DIAGNOSIS — I10 Essential (primary) hypertension: Secondary | ICD-10-CM | POA: Diagnosis not present

## 2019-12-05 LAB — POCT URINALYSIS DIPSTICK
Bilirubin, UA: NEGATIVE
Blood, UA: NEGATIVE
Glucose, UA: NEGATIVE
Ketones, UA: NEGATIVE
Leukocytes, UA: NEGATIVE
Nitrite, UA: NEGATIVE
Protein, UA: NEGATIVE
Spec Grav, UA: 1.015 (ref 1.010–1.025)
Urobilinogen, UA: NEGATIVE E.U./dL — AB
pH, UA: 6 (ref 5.0–8.0)

## 2019-12-05 LAB — POCT UA - MICROALBUMIN: Microalbumin Ur, POC: NEGATIVE mg/L

## 2019-12-05 NOTE — Assessment & Plan Note (Signed)
Well controlled.  ?No changes to medicines.  ?Continue to work on eating a healthy diet and exercise.  ?Labs drawn today.  ?

## 2019-12-05 NOTE — Progress Notes (Signed)
Established Patient Office Visit  Subjective:  Patient ID: Monique [name], female    DOB: Oct 20, 1975  Age: 44 y.o. MRN: [id number]  CC:  Chief Complaint  Patient presents with  . Diabetes    fasting follow up  . Hyperlipidemia  . Hypertension    HPI Monique [name] presents for follow up diabetes  Pt with history of NIDDM - has had for a few years - she is currently taking glucophage 1051m qd She voices no problems or concerns - says her glucose has been ranging well with most fasting levels in 90s  Mixed hyperlipidemia  Pt presents with hyperlipidemia. . Compliance with treatment has been goodThe patient is compliant with medications, maintains a low cholesterol diet , follows up as directed , and maintains an exercise regimen . The patient denies experiencing any hypercholesterolemia related symptoms.  She is currently taking atorvastatin 137mqd  Pt presents for follow up of hypertension.  The patient is tolerating the medication well without side effects. Compliance with treatment has been good; including taking medication as directed , maintains a healthy diet and regular exercise regimen , and following up as directed. She is currently taking lisinopril / hctz 10/12.5m40md   Past Medical History:  Diagnosis Date  . Calculus of kidney   . Essential (primary) hypertension   . Irritable bowel syndrome with diarrhea   . Mixed hyperlipidemia   . Other microscopic hematuria   . Right lower quadrant abdominal tenderness   . Type 2 diabetes mellitus without complications ([place]   History reviewed. No pertinent surgical history.  History reviewed. No pertinent family history.  Social History   Socioeconomic History  . Marital status: Married    Spouse name: Not on file  . Number of children: Not on file  . Years of education: Not on file  . Highest education level: Not on file  Occupational History  . Not on file  Tobacco Use  . Smoking status: Not on file   Substance and Sexual Activity  . Alcohol use: Not on file  . Drug use: Not on file  . Sexual activity: Not on file  Other Topics Concern  . Not on file  Social History Narrative  . Not on file   Social Determinants of Health   Financial Resource Strain:   . Difficulty of Paying Living Expenses:   Food Insecurity:   . Worried About RunCharity fundraiser the Last Year:   . RanArboriculturist the Last Year:   Transportation Needs:   . LacFilm/video editoredical):   . LMarland Kitchenck of Transportation (Non-Medical):   Physical Activity:   . Days of Exercise per Week:   . Minutes of Exercise per Session:   Stress:   . Feeling of Stress :   Social Connections:   . Frequency of Communication with Friends and Family:   . Frequency of Social Gatherings with Friends and Family:   . Attends Religious Services:   . Active Member of Clubs or Organizations:   . Attends CluArchivistetings:   . MMarland Kitchenrital Status:   Intimate Partner Violence:   . Fear of Current or Ex-Partner:   . Emotionally Abused:   . PMarland Kitchenysically Abused:   . Sexually Abused:      Current Outpatient Medications:  .  albuterol (VENTOLIN HFA) 108 (90 Base) MCG/ACT inhaler, INHALE 1   2 PUFFS BY MOUTH EVERY 4 HOURS AS NEEDED, Disp: , Rfl:  .  atenolol (TENORMIN) 50 MG tablet, Take 50 mg by mouth daily., Disp: , Rfl:  .  atorvastatin (LIPITOR) 10 MG tablet, Take 10 mg by mouth daily., Disp: , Rfl:  .  dicyclomine (BENTYL) 10 MG capsule, Take 10 mg by mouth 2 (two) times daily as needed., Disp: , Rfl:  .  EPINEPHrine (EPIPEN 2-PAK) 0.3 mg/0.3 mL IJ SOAJ injection, Inject 0.3 mg into the muscle once., Disp: , Rfl:  .  fexofenadine (ALLEGRA) 180 MG tablet, Take 180 mg by mouth daily., Disp: , Rfl:  .  lisinopril-hydrochlorothiazide (ZESTORETIC) 10-12.5 MG tablet, TAKE 1 TABLET BY MOUTH EVERY DAY, Disp: 30 tablet, Rfl: 0 .  losartan (COZAAR) 50 MG tablet, Take 50 mg by mouth daily., Disp: , Rfl:  .  metFORMIN (GLUCOPHAGE)  1000 MG tablet, TAKE 1 TABLET BY MOUTH EVERY DAY, Disp: 30 tablet, Rfl: 0 .  montelukast (SINGULAIR) 10 MG tablet, Take 10 mg by mouth daily., Disp: , Rfl:  .  sertraline (ZOLOFT) 50 MG tablet, Take 50 mg by mouth daily., Disp: , Rfl:  .  TRI-ESTARYLLA 0.18/0.215/0.25 MG-35 MCG tablet, Take 1 tablet by mouth daily., Disp: , Rfl:    Allergies  Allergen Reactions  . Sulfa Antibiotics   . Sulfamethoxazole Other (See Comments)    unknown    ROS CONSTITUTIONAL: Negative for chills, fatigue, fever, unintentional weight gain and unintentional weight loss.   CARDIOVASCULAR: Negative for chest pain, dizziness, palpitations and pedal edema.  RESPIRATORY: Negative for recent cough and dyspnea.  GASTROINTESTINAL: Negative for abdominal pain, acid reflux symptoms, constipation, diarrhea, nausea and vomiting.   INTEGUMENTARY: Negative for rash.   PSYCHIATRIC: Negative for sleep disturbance and to question depression screen.  Negative for depression, negative for anhedonia.        Objective:    PHYSICAL EXAM:   VS: BP 132/68 (BP Location: Right Arm, Patient Position: Sitting)   Pulse 78   Temp 97.8 F (36.6 C) (Temporal)   Ht 6' (1.829 m)   Wt 230 lb (104.3 kg)   SpO2 99%   BMI 31.19 kg/m   GEN: Well nourished, well developed, in no acute distress   Cardiac: RRR; no murmurs, rubs, or gallops,no edema - no significant varicosities Respiratory:  normal respiratory rate and pattern with no distress - normal breath sounds with no rales, rhonchi, wheezes or rubs GI: normal bowel sounds, no masses or tenderness . Neuro:  Alert and Oriented x 3, Strength and sensation are intact - CN II-Xii grossly intact Psych: euthymic mood, appropriate affect and demeanor  BP 132/68 (BP Location: Right Arm, Patient Position: Sitting)   Pulse 78   Temp 97.8 F (36.6 C) (Temporal)   Ht 6' (1.829 m)   Wt 230 lb (104.3 kg)   SpO2 99%   BMI 31.19 kg/m  Wt Readings from Last 3 Encounters:  12/05/19  230 lb (104.3 kg)   Office Visit on 12/05/2019  Component Date Value Ref Range Status  . Glucose, UA 12/05/2019 Negative  Negative Final  . Bilirubin, UA 12/05/2019 negative   Final  . Ketones, UA 12/05/2019 negative   Final  . Spec Grav, UA 12/05/2019 1.015  1.010 - 1.025 Final  . Blood, UA 12/05/2019 negative   Final  . pH, UA 12/05/2019 6.0  5.0 - 8.0 Final  . Protein, UA 12/05/2019 Negative  Negative Final  . Urobilinogen, UA 12/05/2019 negative* 0.2 or 1.0 E.U./dL Final  . Nitrite, UA 12/05/2019 negative   Final  . Leukocytes, UA 12/05/2019 Negative  Negative Final  . Microalbumin Ur, POC 12/05/2019 negative  mg/L Final     Health Maintenance Due  Topic Date Due  . HEMOGLOBIN A1C  Never done  . PNEUMOCOCCAL POLYSACCHARIDE VACCINE AGE 64-64 HIGH RISK  Never done  . OPHTHALMOLOGY EXAM  Never done  . HIV Screening  Never done  . PAP SMEAR-Modifier  Never done    There are no preventive care reminders to display for this patient.  No results found for: TSH No results found for: WBC, HGB, HCT, MCV, PLT No results found for: NA, K, CHLORIDE, CO2, GLUCOSE, BUN, CREATININE, BILITOT, ALKPHOS, AST, ALT, PROT, ALBUMIN, CALCIUM, ANIONGAP, EGFR, GFR No results found for: CHOL No results found for: HDL No results found for: LDLCALC No results found for: TRIG No results found for: CHOLHDL No results found for: HGBA1C    Assessment & Plan:   Problem List Items Addressed This Visit      Cardiovascular and Mediastinum   Essential hypertension    Well controlled.  No changes to medicines.  Continue to work on eating a healthy diet and exercise.  Labs drawn today.        Relevant Medications   atorvastatin (LIPITOR) 10 MG tablet   Other Relevant Orders   CBC with Differential/Platelet   Comprehensive metabolic panel     Endocrine   Type 2 diabetes mellitus without complication, without long-term current use of insulin ([place]) - Primary    Well controlled.  No changes to  medicines.  Continue to work on eating a healthy diet and exercise.  Labs drawn today.        Relevant Medications   atorvastatin (LIPITOR) 10 MG tablet   Other Relevant Orders   POCT urinalysis dipstick (Completed)   POCT UA - Microalbumin (Completed)   Hemoglobin A1c     Other   Mixed hyperlipidemia    Well controlled.  No changes to medicines.  Continue to work on eating a healthy diet and exercise.  Labs drawn today.        Relevant Medications   atorvastatin (LIPITOR) 10 MG tablet   Other Relevant Orders   Lipid panel      No orders of the defined types were placed in this encounter.   Follow-up: Return in about 6 months (around 06/06/2020) for chronic fasting follow up.    SARA R DAVIS, PA-C

## 2019-12-06 LAB — COMPREHENSIVE METABOLIC PANEL
ALT: 7 IU/L (ref 0–32)
AST: 10 IU/L (ref 0–40)
Albumin/Globulin Ratio: 1.7 (ref 1.2–2.2)
Albumin: 4.2 g/dL (ref 3.8–4.8)
Alkaline Phosphatase: 48 IU/L (ref 48–121)
BUN/Creatinine Ratio: 21 (ref 9–23)
BUN: 12 mg/dL (ref 6–24)
Bilirubin Total: 0.5 mg/dL (ref 0.0–1.2)
CO2: 23 mmol/L (ref 20–29)
Calcium: 9.5 mg/dL (ref 8.7–10.2)
Chloride: 98 mmol/L (ref 96–106)
Creatinine, Ser: 0.57 mg/dL (ref 0.57–1.00)
GFR calc Af Amer: 133 mL/min/{1.73_m2} (ref >59)
GFR calc non Af Amer: 116 mL/min/{1.73_m2} (ref >59)
Globulin, Total: 2.5 g/dL (ref 1.5–4.5)
Glucose: 196 mg/dL — ABNORMAL HIGH (ref 65–99)
Potassium: 4.2 mmol/L (ref 3.5–5.2)
Sodium: 135 mmol/L (ref 134–144)
Total Protein: 6.7 g/dL (ref 6.0–8.5)

## 2019-12-06 LAB — CBC WITH DIFFERENTIAL/PLATELET
Basophils Absolute: 0 10*3/uL (ref 0.0–0.2)
Basos: 1 %
EOS (ABSOLUTE): 0.2 10*3/uL (ref 0.0–0.4)
Eos: 2 %
Hematocrit: 41.2 % (ref 34.0–46.6)
Hemoglobin: 13.2 g/dL (ref 11.1–15.9)
Immature Grans (Abs): 0 10*3/uL (ref 0.0–0.1)
Immature Granulocytes: 0 %
Lymphocytes Absolute: 1.9 10*3/uL (ref 0.7–3.1)
Lymphs: 26 %
MCH: 28.8 pg (ref 26.6–33.0)
MCHC: 32 g/dL (ref 31.5–35.7)
MCV: 90 fL (ref 79–97)
Monocytes Absolute: 0.4 10*3/uL (ref 0.1–0.9)
Monocytes: 5 %
Neutrophils Absolute: 4.7 10*3/uL (ref 1.4–7.0)
Neutrophils: 66 %
Platelets: 314 10*3/uL (ref 150–450)
RBC: 4.58 x10E6/uL (ref 3.77–5.28)
RDW: 12.3 % (ref 11.7–15.4)
WBC: 7.2 10*3/uL (ref 3.4–10.8)

## 2019-12-06 LAB — LIPID PANEL
Chol/HDL Ratio: 2.7 ratio (ref 0.0–4.4)
Cholesterol, Total: 173 mg/dL (ref 100–199)
HDL: 64 mg/dL (ref >39)
LDL Chol Calc (NIH): 64 mg/dL (ref 0–99)
Triglycerides: 285 mg/dL — ABNORMAL HIGH (ref 0–149)
VLDL Cholesterol Cal: 45 mg/dL — ABNORMAL HIGH (ref 5–40)

## 2019-12-06 LAB — HEMOGLOBIN A1C
Est. average glucose Bld gHb Est-mCnc: 163 mg/dL
Hgb A1c MFr Bld: 7.3 % — ABNORMAL HIGH (ref 4.8–5.6)

## 2019-12-06 LAB — CARDIOVASCULAR RISK ASSESSMENT

## 2019-12-14 ENCOUNTER — Other Ambulatory Visit: Payer: Self-pay | Admitting: Physician Assistant

## 2019-12-19 ENCOUNTER — Other Ambulatory Visit: Payer: Self-pay | Admitting: Physician Assistant

## 2019-12-31 ENCOUNTER — Other Ambulatory Visit: Payer: Self-pay

## 2019-12-31 MED ORDER — LISINOPRIL-HYDROCHLOROTHIAZIDE 10-12.5 MG PO TABS: 1.0000 | ORAL_TABLET | Freq: Every day | ORAL | 0 refills | Status: DC

## 2020-01-13 ENCOUNTER — Other Ambulatory Visit: Payer: Self-pay | Admitting: Physician Assistant

## 2020-01-17 ENCOUNTER — Other Ambulatory Visit: Payer: Self-pay | Admitting: Family Medicine

## 2020-01-17 ENCOUNTER — Other Ambulatory Visit: Payer: Self-pay | Admitting: Physician Assistant

## 2020-03-11 ENCOUNTER — Other Ambulatory Visit: Payer: Self-pay | Admitting: Physician Assistant

## 2020-03-13 ENCOUNTER — Other Ambulatory Visit: Payer: Self-pay | Admitting: Physician Assistant

## 2020-03-28 ENCOUNTER — Other Ambulatory Visit: Payer: Self-pay | Admitting: Physician Assistant

## 2020-05-03 ENCOUNTER — Other Ambulatory Visit: Payer: Self-pay | Admitting: Physician Assistant

## 2020-05-04 NOTE — Telephone Encounter (Signed)
Please call and scheduled pt a fasting follow up visit within the next month for her diabetes. Kc

## 2020-06-02 ENCOUNTER — Other Ambulatory Visit: Payer: Self-pay | Admitting: Family Medicine

## 2020-06-03 ENCOUNTER — Other Ambulatory Visit: Payer: Self-pay

## 2020-06-03 ENCOUNTER — Encounter: Payer: Self-pay | Admitting: Physician Assistant

## 2020-06-03 ENCOUNTER — Ambulatory Visit: Payer: BC Managed Care – PPO | Admitting: Physician Assistant

## 2020-06-03 VITALS — BP 122/78 | HR 80 | Temp 97.3°F | Ht 72.0 in | Wt 228.4 lb

## 2020-06-03 DIAGNOSIS — E119 Type 2 diabetes mellitus without complications: Secondary | ICD-10-CM | POA: Diagnosis not present

## 2020-06-03 DIAGNOSIS — I10 Essential (primary) hypertension: Secondary | ICD-10-CM

## 2020-06-03 DIAGNOSIS — E782 Mixed hyperlipidemia: Secondary | ICD-10-CM | POA: Diagnosis not present

## 2020-06-03 DIAGNOSIS — J060 Acute laryngopharyngitis: Secondary | ICD-10-CM | POA: Insufficient documentation

## 2020-06-03 MED ORDER — METFORMIN HCL 1000 MG PO TABS: 1000.0000 mg | ORAL_TABLET | Freq: Every day | ORAL | 1 refills | Status: DC

## 2020-06-03 MED ORDER — ATORVASTATIN CALCIUM 10 MG PO TABS: 10.0000 mg | ORAL_TABLET | Freq: Every day | ORAL | 1 refills | Status: DC

## 2020-06-03 MED ORDER — AZITHROMYCIN 250 MG PO TABS: ORAL_TABLET | ORAL | 0 refills | Status: DC

## 2020-06-03 NOTE — Assessment & Plan Note (Signed)
Well controlled.  ?No changes to medicines.  ?Continue to work on eating a healthy diet and exercise.  ?Labs drawn today.  ?

## 2020-06-03 NOTE — Telephone Encounter (Signed)
Appt today. Kc  

## 2020-06-03 NOTE — Assessment & Plan Note (Signed)
rx for zpack as directed 

## 2020-06-03 NOTE — Progress Notes (Signed)
Established Patient Office Visit  Subjective:  Patient ID: Monique [name], female    DOB: 04/13/1976  Age: 44 y.o. MRN: [id number]  CC:  Chief Complaint  Patient presents with   Diabetes    follow up    HPI Monique [name] presents for follow up diabetes  Pt with history of NIDDM - has had for a few years - she is currently taking glucophage 1016m qd She voices no problems or concerns - says her glucose has been ranging well with most fasting levels in 90s but Sunday had elevated at 200  Mixed hyperlipidemia  Pt presents with hyperlipidemia. . Compliance with treatment has been goodThe patient is compliant with medications, maintains a low cholesterol diet , follows up as directed , and maintains an exercise regimen . The patient denies experiencing any hypercholesterolemia related symptoms.  She is currently taking atorvastatin 1396mqd  Pt presents for follow up of hypertension.  The patient is tolerating the medication well without side effects. Compliance with treatment has been good; including taking medication as directed , maintains a healthy diet and regular exercise regimen , and following up as directed. She is currently taking lisinopril / hctz 10/12.96m11md  Pt complains of uri symptoms - complains of mild pnd and sinus drainage - denies fever, malaise Past Medical History:  Diagnosis Date   Calculus of kidney    Essential (primary) hypertension    Irritable bowel syndrome with diarrhea    Mixed hyperlipidemia    Other microscopic hematuria    Right lower quadrant abdominal tenderness    Type 2 diabetes mellitus without complications ([facility]   History reviewed. No pertinent surgical history.  History reviewed. No pertinent family history.  Social History   Socioeconomic History   Marital status: Married    Spouse name: Not on file   Number of children: Not on file   Years of education: Not on file   Highest education level: Not on file    Occupational History   Not on file  Tobacco Use   Smoking status: Never Smoker   Smokeless tobacco: Never Used  Vaping Use   Vaping Use: Never used  Substance and Sexual Activity   Alcohol use: Never   Drug use: Never   Sexual activity: Not on file  Other Topics Concern   Not on file  Social History Narrative   Not on file   Social Determinants of Health   Financial Resource Strain:    Difficulty of Paying Living Expenses: Not on file  Food Insecurity:    Worried About RunSt. [place] the Last Year: Not on file   Ran Out of Food in the Last Year: Not on file  Transportation Needs:    Lack of Transportation (Medical): Not on file   Lack of Transportation (Non-Medical): Not on file  Physical Activity:    Days of Exercise per Week: Not on file   Minutes of Exercise per Session: Not on file  Stress:    Feeling of Stress : Not on file  Social Connections:    Frequency of Communication with Friends and Family: Not on file   Frequency of Social Gatherings with Friends and Family: Not on file   Attends Religious Services: Not on file   Active Member of Clubs or Organizations: Not on file   Attends CluArchivistetings: Not on file   Marital Status: Not on file  Intimate Partner Violence:    Fear  of Current or Ex-Partner: Not on file   Emotionally Abused: Not on file   Physically Abused: Not on file   Sexually Abused: Not on file     Current Outpatient Medications:    albuterol (VENTOLIN HFA) 108 (90 Base) MCG/ACT inhaler, INHALE 1   2 PUFFS BY MOUTH EVERY 4 HOURS AS NEEDED, Disp: , Rfl:    atorvastatin (LIPITOR) 10 MG tablet, Take 1 tablet (10 mg total) by mouth daily., Disp: 90 tablet, Rfl: 1   dicyclomine (BENTYL) 10 MG capsule, TAKE 1 CAPSULE BY MOUTH TWICE A DAY AS NEEDED FOR ABDOMNIMAL PAIN, Disp: 180 capsule, Rfl: 2   EPINEPHrine (EPIPEN 2-PAK) 0.3 mg/0.3 mL IJ SOAJ injection, Inject 0.3 mg into the muscle once., Disp: ,  Rfl:    fexofenadine (ALLEGRA) 180 MG tablet, Take 180 mg by mouth daily., Disp: , Rfl:    lisinopril-hydrochlorothiazide (ZESTORETIC) 10-12.5 MG tablet, TAKE 1 TABLET BY MOUTH EVERY DAY, Disp: 90 tablet, Rfl: 0   metFORMIN (GLUCOPHAGE) 1000 MG tablet, Take 1 tablet (1,000 mg total) by mouth daily., Disp: 90 tablet, Rfl: 1   montelukast (SINGULAIR) 10 MG tablet, Take 10 mg by mouth daily., Disp: , Rfl:    TRI-ESTARYLLA 0.18/0.215/0.25 MG-35 MCG tablet, Take 1 tablet by mouth daily., Disp: , Rfl:    azithromycin (ZITHROMAX) 250 MG tablet, 2 po day one then 1 po days 2-5, Disp: 6 tablet, Rfl: 0   Allergies  Allergen Reactions   Sulfa Antibiotics    Sulfamethoxazole Other (See Comments)    unknown    ROS CONSTITUTIONAL: Negative for chills, fatigue, fever, unintentional weight gain and unintentional weight loss.  ENT -- see HPI CARDIOVASCULAR: Negative for chest pain, dizziness, palpitations and pedal edema.  RESPIRATORY: Negative for recent cough and dyspnea.  GASTROINTESTINAL: Negative for abdominal pain, acid reflux symptoms, constipation, diarrhea, nausea and vomiting.   INTEGUMENTARY: Negative for rash.   PSYCHIATRIC: Negative for sleep disturbance and to question depression screen.  Negative for depression, negative for anhedonia.        Objective:    PHYSICAL EXAM:   VS: BP 122/78 (BP Location: Left Arm, Patient Position: Sitting, Cuff Size: Normal)    Pulse 80    Temp (!) 97.3 F (36.3 C) (Temporal)    Ht 6' (1.829 m)    Wt 228 lb 6.4 oz (103.6 kg)    SpO2 100%    BMI 30.98 kg/m   GEN: Well nourished, well developed, in no acute distress  HEENT - mild erythema and PND - TMS normal Cardiac: RRR; no murmurs, rubs, or gallops,no edema -  Respiratory:  normal respiratory rate and pattern with no distress - normal breath sounds with no rales, rhonchi, wheezes or rubs GI: normal bowel sounds, no masses or tenderness Psych: euthymic mood, appropriate affect and  demeanor  BP 122/78 (BP Location: Left Arm, Patient Position: Sitting, Cuff Size: Normal)    Pulse 80    Temp (!) 97.3 F (36.3 C) (Temporal)    Ht 6' (1.829 m)    Wt 228 lb 6.4 oz (103.6 kg)    SpO2 100%    BMI 30.98 kg/m  Wt Readings from Last 3 Encounters:  06/03/20 228 lb 6.4 oz (103.6 kg)  12/05/19 230 lb (104.3 kg)   No visits with results within 1 Day(s) from this visit.  Latest known visit with results is:  Office Visit on 12/05/2019  Component Date Value Ref Range Status   Glucose, UA 12/05/2019 Negative  Negative Final  Bilirubin, UA 12/05/2019 negative   Final   Ketones, UA 12/05/2019 negative   Final   Spec Grav, UA 12/05/2019 1.015  1.010 - 1.025 Final   Blood, UA 12/05/2019 negative   Final   pH, UA 12/05/2019 6.0  5.0 - 8.0 Final   Protein, UA 12/05/2019 Negative  Negative Final   Urobilinogen, UA 12/05/2019 negative* 0.2 or 1.0 E.U./dL Final   Nitrite, UA 12/05/2019 negative   Final   Leukocytes, UA 12/05/2019 Negative  Negative Final   Microalbumin Ur, POC 12/05/2019 negative  mg/L Final   WBC 12/05/2019 7.2  3.4 - 10.8 x10E3/uL Final   RBC 12/05/2019 4.58  3.77 - 5.28 x10E6/uL Final   Hemoglobin 12/05/2019 13.2  11.1 - 15.9 g/dL Final   Hematocrit 12/05/2019 41.2  34.0 - 46.6 % Final   MCV 12/05/2019 90  79 - 97 fL Final   MCH 12/05/2019 28.8  26.6 - 33.0 pg Final   MCHC 12/05/2019 32.0  31 - 35 g/dL Final   RDW 12/05/2019 12.3  11.7 - 15.4 % Final   Platelets 12/05/2019 314  150 - 450 x10E3/uL Final   Neutrophils 12/05/2019 66  Not Estab. % Final   Lymphs 12/05/2019 26  Not Estab. % Final   Monocytes 12/05/2019 5  Not Estab. % Final   Eos 12/05/2019 2  Not Estab. % Final   Basos 12/05/2019 1  Not Estab. % Final   Neutrophils Absolute 12/05/2019 4.7  1.40 - 7.00 x10E3/uL Final   Lymphocytes Absolute 12/05/2019 1.9  0 - 3 x10E3/uL Final   Monocytes Absolute 12/05/2019 0.4  0 - 0 x10E3/uL Final   EOS (ABSOLUTE) 12/05/2019 0.2  0.0  - 0.4 x10E3/uL Final   Basophils Absolute 12/05/2019 0.0  0 - 0 x10E3/uL Final   Immature Granulocytes 12/05/2019 0  Not Estab. % Final   Immature Grans (Abs) 12/05/2019 0.0  0.0 - 0.1 x10E3/uL Final   Glucose 12/05/2019 196* 65 - 99 mg/dL Final   BUN 12/05/2019 12  6 - 24 mg/dL Final   Creatinine, Ser 12/05/2019 0.57  0.57 - 1.00 mg/dL Final   GFR calc non Af Amer 12/05/2019 116  >59 mL/min/1.73 Final   GFR calc Af Amer 12/05/2019 133  >59 mL/min/1.73 Final   Comment: **[facility] currently reports eGFR in compliance with the current**   recommendations of the [employer]. [facility] will   update reporting as new guidelines are published from the NKF-ASN   Task force.    BUN/Creatinine Ratio 12/05/2019 21  9 - 23 Final   Sodium 12/05/2019 135  134 - 144 mmol/L Final   Potassium 12/05/2019 4.2  3.5 - 5.2 mmol/L Final   Chloride 12/05/2019 98  96 - 106 mmol/L Final   CO2 12/05/2019 23  20 - 29 mmol/L Final   Calcium 12/05/2019 9.5  8.7 - 10.2 mg/dL Final   Total Protein 12/05/2019 6.7  6.0 - 8.5 g/dL Final   Albumin 12/05/2019 4.2  3.8 - 4.8 g/dL Final   Globulin, Total 12/05/2019 2.5  1.5 - 4.5 g/dL Final   Albumin/Globulin Ratio 12/05/2019 1.7  1.2 - 2.2 Final   Bilirubin Total 12/05/2019 0.5  0.0 - 1.2 mg/dL Final   Alkaline Phosphatase 12/05/2019 48  48 - 121 IU/L Final                 **Please note reference interval change**   AST 12/05/2019 10  0 - 40 IU/L Final   ALT 12/05/2019 7  0 - 32 IU/L Final   Cholesterol, Total 12/05/2019 173  100 - 199 mg/dL Final   Triglycerides 12/05/2019 285* 0 - 149 mg/dL Final   HDL 12/05/2019 64  >39 mg/dL Final   VLDL Cholesterol Cal 12/05/2019 45* 5 - 40 mg/dL Final   LDL Chol Calc (NIH) 12/05/2019 64  0 - 99 mg/dL Final   Chol/HDL Ratio 12/05/2019 2.7  0.0 - 4.4 ratio Final   Comment:                                   T. Chol/HDL Ratio                                             Men  Women                                1/2 Avg.Risk  3.4    3.3                                   Avg.Risk  5.0    4.4                                2X Avg.Risk  9.6    7.1                                3X Avg.Risk 23.4   11.0    Hgb A1c MFr Bld 12/05/2019 7.3* 4.8 - 5.6 % Final   Comment:          Prediabetes: 5.7 - 6.4          Diabetes: >6.4          Glycemic control for adults with diabetes: <7.0    Est. average glucose Bld gHb Est-m* 12/05/2019 163  mg/dL Final   Interpretation 12/05/2019 Note   Final   Supplemental report is available.     There are no preventive care reminders to display for this patient.  There are no preventive care reminders to display for this patient.  No results found for: TSH Lab Results  Component Value Date   WBC 7.2 12/05/2019   HGB 13.2 12/05/2019   HCT 41.2 12/05/2019   MCV 90 12/05/2019   PLT 314 12/05/2019   Lab Results  Component Value Date   NA 135 12/05/2019   K 4.2 12/05/2019   CO2 23 12/05/2019   GLUCOSE 196 (H) 12/05/2019   BUN 12 12/05/2019   CREATININE 0.57 12/05/2019   BILITOT 0.5 12/05/2019   ALKPHOS 48 12/05/2019   AST 10 12/05/2019   ALT 7 12/05/2019   PROT 6.7 12/05/2019   ALBUMIN 4.2 12/05/2019   CALCIUM 9.5 12/05/2019   Lab Results  Component Value Date   CHOL 173 12/05/2019   Lab Results  Component Value Date   HDL 64 12/05/2019   Lab Results  Component Value Date   LDLCALC 64 12/05/2019   Lab Results  Component Value Date   TRIG 285 (H) 12/05/2019   Lab Results  Component Value  Date   CHOLHDL 2.7 12/05/2019   Lab Results  Component Value Date   HGBA1C 7.3 (H) 12/05/2019      Assessment & Plan:   Problem List Items Addressed This Visit      Cardiovascular and Mediastinum   Essential hypertension - Primary    Well controlled.  No changes to medicines.  Continue to work on eating a healthy diet and exercise.  Labs drawn today.        Relevant Medications   atorvastatin (LIPITOR) 10 MG tablet    Other Relevant Orders   CBC with Differential/Platelet   Comprehensive metabolic panel   TSH     Respiratory   Acute laryngopharyngitis    rx for zpack as directed      Relevant Medications   azithromycin (ZITHROMAX) 250 MG tablet     Endocrine   Type 2 diabetes mellitus without complication, without long-term current use of insulin ([facility])   Relevant Medications   atorvastatin (LIPITOR) 10 MG tablet   metFORMIN (GLUCOPHAGE) 1000 MG tablet   Other Relevant Orders   Hemoglobin A1c     Other   Mixed hyperlipidemia   Relevant Medications   atorvastatin (LIPITOR) 10 MG tablet   Other Relevant Orders   Lipid panel      Meds ordered this encounter  Medications   azithromycin (ZITHROMAX) 250 MG tablet    Sig: 2 po day one then 1 po days 2-5    Dispense:  6 tablet    Refill:  0    Order Specific Question:   Supervising Provider    Answer:   Shelton Silvas   atorvastatin (LIPITOR) 10 MG tablet    Sig: Take 1 tablet (10 mg total) by mouth daily.    Dispense:  90 tablet    Refill:  1    Order Specific Question:   Supervising Provider    Answer:   Shelton Silvas   metFORMIN (GLUCOPHAGE) 1000 MG tablet    Sig: Take 1 tablet (1,000 mg total) by mouth daily.    Dispense:  90 tablet    Refill:  1    Order Specific Question:   Supervising Provider    Answer:   Shelton Silvas    Follow-up: Return in about 6 months (around 12/01/2020) for chronic fasting.    SARA R Daneli Butkiewicz, PA-C

## 2020-06-04 LAB — CARDIOVASCULAR RISK ASSESSMENT

## 2020-06-04 LAB — LIPID PANEL
Chol/HDL Ratio: 2.5 ratio (ref 0.0–4.4)
Cholesterol, Total: 172 mg/dL (ref 100–199)
HDL: 70 mg/dL (ref >39)
LDL Chol Calc (NIH): 64 mg/dL (ref 0–99)
Triglycerides: 238 mg/dL — ABNORMAL HIGH (ref 0–149)
VLDL Cholesterol Cal: 38 mg/dL (ref 5–40)

## 2020-06-04 LAB — CBC WITH DIFFERENTIAL/PLATELET
Basophils Absolute: 0 10*3/uL (ref 0.0–0.2)
Basos: 1 %
EOS (ABSOLUTE): 0.2 10*3/uL (ref 0.0–0.4)
Eos: 3 %
Hematocrit: 41.6 % (ref 34.0–46.6)
Hemoglobin: 13.8 g/dL (ref 11.1–15.9)
Immature Grans (Abs): 0 10*3/uL (ref 0.0–0.1)
Immature Granulocytes: 0 %
Lymphocytes Absolute: 1.8 10*3/uL (ref 0.7–3.1)
Lymphs: 25 %
MCH: 29.2 pg (ref 26.6–33.0)
MCHC: 33.2 g/dL (ref 31.5–35.7)
MCV: 88 fL (ref 79–97)
Monocytes Absolute: 0.4 10*3/uL (ref 0.1–0.9)
Monocytes: 5 %
Neutrophils Absolute: 4.9 10*3/uL (ref 1.4–7.0)
Neutrophils: 66 %
Platelets: 297 10*3/uL (ref 150–450)
RBC: 4.72 x10E6/uL (ref 3.77–5.28)
RDW: 11.9 % (ref 11.7–15.4)
WBC: 7.4 10*3/uL (ref 3.4–10.8)

## 2020-06-04 LAB — COMPREHENSIVE METABOLIC PANEL
ALT: 8 IU/L (ref 0–32)
AST: 9 IU/L (ref 0–40)
Albumin/Globulin Ratio: 1.6 (ref 1.2–2.2)
Albumin: 3.9 g/dL (ref 3.8–4.8)
Alkaline Phosphatase: 49 IU/L (ref 44–121)
BUN/Creatinine Ratio: 19 (ref 9–23)
BUN: 12 mg/dL (ref 6–24)
Bilirubin Total: 0.5 mg/dL (ref 0.0–1.2)
CO2: 23 mmol/L (ref 20–29)
Calcium: 9.2 mg/dL (ref 8.7–10.2)
Chloride: 99 mmol/L (ref 96–106)
Creatinine, Ser: 0.62 mg/dL (ref 0.57–1.00)
GFR calc Af Amer: 129 mL/min/{1.73_m2} (ref >59)
GFR calc non Af Amer: 112 mL/min/{1.73_m2} (ref >59)
Globulin, Total: 2.5 g/dL (ref 1.5–4.5)
Glucose: 226 mg/dL — ABNORMAL HIGH (ref 65–99)
Potassium: 4.4 mmol/L (ref 3.5–5.2)
Sodium: 136 mmol/L (ref 134–144)
Total Protein: 6.4 g/dL (ref 6.0–8.5)

## 2020-06-04 LAB — HEMOGLOBIN A1C
Est. average glucose Bld gHb Est-mCnc: 171 mg/dL
Hgb A1c MFr Bld: 7.6 % — ABNORMAL HIGH (ref 4.8–5.6)

## 2020-06-04 LAB — TSH: TSH: 1.5 u[IU]/mL (ref 0.450–4.500)

## 2020-06-05 ENCOUNTER — Other Ambulatory Visit: Payer: Self-pay | Admitting: Physician Assistant

## 2020-06-05 DIAGNOSIS — E119 Type 2 diabetes mellitus without complications: Secondary | ICD-10-CM

## 2020-06-05 MED ORDER — METFORMIN HCL 1000 MG PO TABS: 1000.0000 mg | ORAL_TABLET | Freq: Two times a day (BID) | ORAL | 1 refills | Status: DC

## 2020-07-09 ENCOUNTER — Encounter: Payer: Self-pay | Admitting: Family Medicine

## 2020-07-09 ENCOUNTER — Telehealth (INDEPENDENT_AMBULATORY_CARE_PROVIDER_SITE_OTHER): Payer: BC Managed Care – PPO | Admitting: Family Medicine

## 2020-07-09 VITALS — Temp 97.1°F | Ht 72.0 in | Wt 223.0 lb

## 2020-07-09 DIAGNOSIS — R0989 Other specified symptoms and signs involving the circulatory and respiratory systems: Secondary | ICD-10-CM | POA: Diagnosis not present

## 2020-07-09 DIAGNOSIS — I10 Essential (primary) hypertension: Secondary | ICD-10-CM

## 2020-07-09 MED ORDER — AZITHROMYCIN 250 MG PO TABS: ORAL_TABLET | ORAL | 0 refills | Status: DC

## 2020-07-09 NOTE — Patient Instructions (Signed)
xyzal -once a day Pick up zpack at pharmacy flonase-2 sprays to each nostril  Nasal spray

## 2020-07-09 NOTE — Progress Notes (Signed)
States in November saw Monique [name], the Sunday before was at dads who has horses and had same thing-congestion. Pt given Zpack for treatment Was at dads for christmas with increase exposure-children had photos made with miniature horses. Pt is also having house construction with increase dust. Pt has carpet in bedroom. She is allergic to dogs. Pt states she is gargled salt water this morning and coughed up green mucous Temp 97.1. Was using inhalers yesterday.   Complains chest congestion, R ear pain Denies cough, fever, runny nose, SOB, body aches, sore throat.  Had 2 pfizer vaccines last in April, did not have flu shot. In November was told she needed pneumonia shot will get that next office visit.   No known exposures.   I connected with Olam Idler on 07/09/20 at 10:00 AM EST by telephone  verified that I am speaking with the correct person using two identifiers.  Location: Patient: home Provider: clinic   I discussed the limitations of evaluation and management by telemedicine and the availability of in person appointments. The patient expressed understanding and agreed to proceed. Converted to in patient visit after NEGATIVE COVID to inperson visit History of Present Illness: Pt with difficulty breathing all day-used inhalers x 3 yesterday Construction in the house-sanding and painting over last several days Pt allergic to horses-family took photos  No fever Cough productive-expectorant Gargles with salt water-green mucous Sneezing No sore throat NO fatigue or body aches No n/v/d No rash No loss taste or smell COVID vaccines-booster next week No upper respiratory exposure Allergies to dogs -use albuterol, zyrtec BID NO asthma Observations/Objective: 97.1 Today's Vitals   07/09/20 0957  Temp: (!) 97.1 F (36.2 C)  Weight: 223 lb (101.2 kg)  Height: 6' (1.829 m)   Body mass index is 30.24 kg/m.   Physical Exam Constitutional:      General: She is not in acute  distress.    Appearance: Normal appearance. She is not ill-appearing.  HENT:     Head: Normocephalic and atraumatic.     Right Ear: Tympanic membrane normal.     Left Ear: Tympanic membrane normal.     Nose: Congestion present.     Mouth/Throat:     Mouth: Mucous membranes are moist.  Eyes:     Conjunctiva/sclera: Conjunctivae normal.  Cardiovascular:     Rate and Rhythm: Normal rate and regular rhythm.     Pulses: Normal pulses.     Heart sounds: Normal heart sounds.  Pulmonary:     Effort: Pulmonary effort is normal.     Breath sounds: Normal breath sounds.  Abdominal:     General: Bowel sounds are normal.     Palpations: Abdomen is soft.  Musculoskeletal:     Cervical back: Normal range of motion and neck supple.  Neurological:     Mental Status: She is alert.     Assessment and Plan: 1. Chest congestion- POC COVID-19-NEGATIVE Suggest xyzal, zithromax-rx Follow Up Instructions: use albuterol as needed, clear house-change filters   I discussed the assessment and treatment plan with the patient. The patient was provided an opportunity to ask questions and all were answered. The patient agreed with the plan and demonstrated an understanding of the instructions.   The patient was advised to call back or seek an in-person evaluation if the symptoms worsen or if the condition fails to improve as anticipated. LISA Mat Carne, MD

## 2020-07-13 ENCOUNTER — Other Ambulatory Visit: Payer: Self-pay

## 2020-07-13 MED ORDER — FLUCONAZOLE 150 MG PO TABS: 150.0000 mg | ORAL_TABLET | Freq: Once | ORAL | 0 refills | Status: AC

## 2020-09-19 ENCOUNTER — Other Ambulatory Visit: Payer: Self-pay | Admitting: Physician Assistant

## 2020-10-06 ENCOUNTER — Other Ambulatory Visit: Payer: Self-pay | Admitting: Physician Assistant

## 2020-12-02 ENCOUNTER — Other Ambulatory Visit: Payer: Self-pay | Admitting: Physician Assistant

## 2020-12-02 DIAGNOSIS — E119 Type 2 diabetes mellitus without complications: Secondary | ICD-10-CM

## 2020-12-21 ENCOUNTER — Other Ambulatory Visit: Payer: Self-pay | Admitting: Physician Assistant

## 2020-12-22 ENCOUNTER — Other Ambulatory Visit: Payer: Self-pay | Admitting: Physician Assistant

## 2020-12-22 DIAGNOSIS — E782 Mixed hyperlipidemia: Secondary | ICD-10-CM

## 2021-01-09 ENCOUNTER — Other Ambulatory Visit: Payer: Self-pay | Admitting: Physician Assistant

## 2021-02-28 ENCOUNTER — Other Ambulatory Visit: Payer: Self-pay | Admitting: Physician Assistant

## 2021-02-28 DIAGNOSIS — E119 Type 2 diabetes mellitus without complications: Secondary | ICD-10-CM

## 2021-03-16 ENCOUNTER — Other Ambulatory Visit: Payer: Self-pay | Admitting: Physician Assistant

## 2021-03-19 ENCOUNTER — Other Ambulatory Visit: Payer: Self-pay

## 2021-03-19 ENCOUNTER — Ambulatory Visit: Payer: BC Managed Care – PPO | Admitting: Physician Assistant

## 2021-03-19 ENCOUNTER — Encounter: Payer: Self-pay | Admitting: Nurse Practitioner

## 2021-03-19 VITALS — BP 100/58 | HR 83 | Temp 97.7°F | Ht 72.0 in | Wt 224.0 lb

## 2021-03-19 DIAGNOSIS — I10 Essential (primary) hypertension: Secondary | ICD-10-CM

## 2021-03-19 DIAGNOSIS — E119 Type 2 diabetes mellitus without complications: Secondary | ICD-10-CM

## 2021-03-19 DIAGNOSIS — E782 Mixed hyperlipidemia: Secondary | ICD-10-CM

## 2021-03-19 MED ORDER — LISINOPRIL-HYDROCHLOROTHIAZIDE 10-12.5 MG PO TABS: 1.0000 | ORAL_TABLET | Freq: Every day | ORAL | 1 refills | Status: DC

## 2021-03-19 NOTE — Progress Notes (Signed)
Established Patient Office Visit  Subjective:  Patient ID: Monique [name], female    DOB: [birth year]  Age: 45 y.o. MRN: [id number]  CC:  Chief Complaint  Patient presents with   Hypertension   Hyperlipidemia   Diabetes    HPI Monique [name] presents for follow up diabetes  Pt with history of NIDDM - has had for a few years - she is currently taking glucophage 1022m bid She voices no problems or concerns - says her glucose has been ranging between 130-140s  Mixed hyperlipidemia  Pt presents with hyperlipidemia. . Compliance with treatment has been good The patient is compliant with medications, maintains a low cholesterol diet , follows up as directed , and maintains an exercise regimen . The patient denies experiencing any hypercholesterolemia related symptoms.  She is currently taking atorvastatin 111mqd  Pt presents for follow up of hypertension.  The patient is tolerating the medication well without side effects. Compliance with treatment has been good; including taking medication as directed , maintains a healthy diet and regular exercise regimen , and following up as directed. She is currently taking lisinopril / hctz 10/12.63m24md  Pt with history of allergies - currently on xyzal Past Medical History:  Diagnosis Date   Calculus of kidney    Essential (primary) hypertension    Irritable bowel syndrome with diarrhea    Mixed hyperlipidemia    Other microscopic hematuria    Right lower quadrant abdominal tenderness    [place]   History reviewed. No pertinent surgical history.  History reviewed. No pertinent family history.  Social History   Socioeconomic History   Marital status: Married    Spouse name: Not on file   Number of children: Not on file   Years of education: Not on file   Highest education level: Not on file  Occupational History   Not on file  Tobacco Use   Smoking status: Never   Smokeless  tobacco: Never  Vaping Use   Vaping Use: Never used  Substance and Sexual Activity   Alcohol use: Never   Drug use: Never   Sexual activity: Not on file  Other Topics Concern   Not on file  Social History Narrative   Not on file   Social Determinants of Health   Financial Resource Strain: Not on file  Food Insecurity: Not on file  Transportation Needs: Not on file  Physical Activity: Not on file  Stress: Not on file  Social Connections: Not on file  Intimate Partner Violence: Not on file     Current Outpatient Medications:    albuterol (VENTOLIN HFA) 108 (90 Base) MCG/ACT inhaler, INHALE 1 - 2 PUFFS BY MOUTH EVERY 4 HOURS AS NEEDED, Disp: 8.5 each, Rfl: 5   atorvastatin (LIPITOR) 10 MG tablet, TAKE 1 TABLET BY MOUTH EVERY DAY, Disp: 90 tablet, Rfl: 1   dicyclomine (BENTYL) 10 MG capsule, TAKE 1 CAPSULE BY MOUTH TWICE A DAY AS NEEDED FOR ABDOMNIMAL PAIN, Disp: 180 capsule, Rfl: 2   EPINEPHrine 0.3 mg/0.3 mL IJ SOAJ injection, Inject 0.3 mg into the muscle once., Disp: , Rfl:    levocetirizine (XYZAL) 5 MG tablet, Take 5 mg by mouth every evening., Disp: , Rfl:    metFORMIN (GLUCOPHAGE) 1000 MG tablet, TAKE 1 TABLET (1,000 MG TOTAL) BY MOUTH 2 (TWO) TIMES DAILY WITH A MEAL., Disp: 180 tablet, Rfl: 0   TRI-ESTARYLLA 0.18/0.215/0.25 MG-35 MCG tablet, Take 1 tablet by  mouth daily., Disp: , Rfl:    lisinopril-hydrochlorothiazide (ZESTORETIC) 10-12.5 MG tablet, Take 1 tablet by mouth daily., Disp: 90 tablet, Rfl: 1   Allergies  Allergen Reactions   Sulfa Antibiotics    Sulfamethoxazole Other (See Comments)    unknown   CONSTITUTIONAL: Negative for chills, fatigue, fever, unintentional weight gain and unintentional weight loss.  E/N/T: Negative for ear pain, nasal congestion and sore throat.  CARDIOVASCULAR: Negative for chest pain, dizziness, palpitations and pedal edema.  RESPIRATORY: Negative for recent cough and dyspnea.  GASTROINTESTINAL: Negative for abdominal pain, acid  reflux symptoms, constipation, diarrhea, nausea and vomiting.  MSK: Negative for arthralgias and myalgias.  INTEGUMENTARY: Negative for rash.  NEUROLOGICAL: Negative for dizziness and headaches.  PSYCHIATRIC: Negative for sleep disturbance and to question depression screen.  Negative for depression, negative for anhedonia.        Objective:   PHYSICAL EXAM:   VS: BP (!) 100/58 (BP Location: Left Arm, Patient Position: Sitting)   Pulse 83   Temp 97.7 F (36.5 C) (Temporal)   Ht 6' (1.829 m)   Wt 224 lb (101.6 kg)   SpO2 99%   BMI 30.38 kg/m   GEN: Well nourished, well developed, in no acute distress  Cardiac: RRR; no murmurs, rubs, or gallops,no edema -  Respiratory:  normal respiratory rate and pattern with no distress - normal breath sounds with no rales, rhonchi, wheezes or rubs Skin: warm and dry, no rash  Psych: euthymic mood, appropriate affect and demeanor  No visits with results within 1 Day(s) from this visit.  Latest known visit with results is:  Office Visit on 06/03/2020  Component Date Value Ref Range Status   WBC 06/03/2020 7.4  3.4 - 10.8 x10E3/uL Final   RBC 06/03/2020 4.72  3.77 - 5.28 x10E6/uL Final   Hemoglobin 06/03/2020 13.8  11.1 - 15.9 g/dL Final   Hematocrit 06/03/2020 41.6  34.0 - 46.6 % Final   MCV 06/03/2020 88  79 - 97 fL Final   MCH 06/03/2020 29.2  26.6 - 33.0 pg Final   MCHC 06/03/2020 33.2  31.5 - 35.7 g/dL Final   RDW 06/03/2020 11.9  11.7 - 15.4 % Final   Platelets 06/03/2020 297  150 - 450 x10E3/uL Final   Neutrophils 06/03/2020 66  Not Estab. % Final   Lymphs 06/03/2020 25  Not Estab. % Final   Monocytes 06/03/2020 5  Not Estab. % Final   Eos 06/03/2020 3  Not Estab. % Final   Basos 06/03/2020 1  Not Estab. % Final   Neutrophils Absolute 06/03/2020 4.9  1.4 - 7.0 x10E3/uL Final   Lymphocytes Absolute 06/03/2020 1.8  0.7 - 3.1 x10E3/uL Final   Monocytes Absolute 06/03/2020 0.4  0.1 - 0.9 x10E3/uL Final   EOS (ABSOLUTE) 06/03/2020 0.2   0.0 - 0.4 x10E3/uL Final   Basophils Absolute 06/03/2020 0.0  0.0 - 0.2 x10E3/uL Final   Immature Granulocytes 06/03/2020 0  Not Estab. % Final   Immature Grans (Abs) 06/03/2020 0.0  0.0 - 0.1 x10E3/uL Final   Glucose 06/03/2020 226 (A) 65 - 99 mg/dL Final   BUN 06/03/2020 12  6 - 24 mg/dL Final   Creatinine, Ser 06/03/2020 0.62  0.57 - 1.00 mg/dL Final   GFR calc non Af Amer 06/03/2020 112  >59 mL/min/1.73 Final   GFR calc Af Amer 06/03/2020 129  >59 mL/min/1.73 Final   Comment: **In accordance with recommendations from the NKF-ASN Task force,**   [facility] is in the process  of updating its eGFR calculation to the   2021 CKD-EPI creatinine equation that estimates kidney function   without a race variable.    BUN/Creatinine Ratio 06/03/2020 19  9 - 23 Final   Sodium 06/03/2020 136  134 - 144 mmol/L Final   Potassium 06/03/2020 4.4  3.5 - 5.2 mmol/L Final   Chloride 06/03/2020 99  96 - 106 mmol/L Final   CO2 06/03/2020 23  20 - 29 mmol/L Final   Calcium 06/03/2020 9.2  8.7 - 10.2 mg/dL Final   Total Protein 06/03/2020 6.4  6.0 - 8.5 g/dL Final   Albumin 06/03/2020 3.9  3.8 - 4.8 g/dL Final   Globulin, Total 06/03/2020 2.5  1.5 - 4.5 g/dL Final   Albumin/Globulin Ratio 06/03/2020 1.6  1.2 - 2.2 Final   Bilirubin Total 06/03/2020 0.5  0.0 - 1.2 mg/dL Final   Alkaline Phosphatase 06/03/2020 49  44 - 121 IU/L Final                 **Please note reference interval change**   AST 06/03/2020 9  0 - 40 IU/L Final   ALT 06/03/2020 8  0 - 32 IU/L Final   TSH 06/03/2020 1.500  0.450 - 4.500 uIU/mL Final   Hgb A1c MFr Bld 06/03/2020 7.6 (A) 4.8 - 5.6 % Final   Comment:          Prediabetes: 5.7 - 6.4          Diabetes: >6.4          Glycemic control for adults with diabetes: <7.0    Est. average glucose Bld gHb Est-m* 06/03/2020 171  mg/dL Final   Cholesterol, Total 06/03/2020 172  100 - 199 mg/dL Final   Triglycerides 06/03/2020 238 (A) 0 - 149 mg/dL Final   HDL 06/03/2020 70  >39 mg/dL Final    VLDL Cholesterol Cal 06/03/2020 38  5 - 40 mg/dL Final   LDL Chol Calc (NIH) 06/03/2020 64  0 - 99 mg/dL Final   Chol/HDL Ratio 06/03/2020 2.5  0.0 - 4.4 ratio Final   Comment:                                   T. Chol/HDL Ratio                                             Men  Women                               1/2 Avg.Risk  3.4    3.3                                   Avg.Risk  5.0    4.4                                2X Avg.Risk  9.6    7.1                                3X Avg.Risk 23.4   11.0  Interpretation 06/03/2020 Note   Final   Supplemental report is available.     Health Maintenance Due  Topic Date Due   PAP SMEAR-Modifier  Never done   OPHTHALMOLOGY EXAM  08/08/2020   HEMOGLOBIN A1C  12/01/2020   FOOT EXAM  12/04/2020    There are no preventive care reminders to display for this patient.  Lab Results  Component Value Date   TSH 1.500 06/03/2020   Lab Results  Component Value Date   WBC 7.4 06/03/2020   HGB 13.8 06/03/2020   HCT 41.6 06/03/2020   MCV 88 06/03/2020   PLT 297 06/03/2020   Lab Results  Component Value Date   NA 136 06/03/2020   K 4.4 06/03/2020   CO2 23 06/03/2020   GLUCOSE 226 (H) 06/03/2020   BUN 12 06/03/2020   CREATININE 0.62 06/03/2020   BILITOT 0.5 06/03/2020   ALKPHOS 49 06/03/2020   AST 9 06/03/2020   ALT 8 06/03/2020   PROT 6.4 06/03/2020   ALBUMIN 3.9 06/03/2020   CALCIUM 9.2 06/03/2020   Lab Results  Component Value Date   CHOL 172 06/03/2020   Lab Results  Component Value Date   HDL 70 06/03/2020   Lab Results  Component Value Date   LDLCALC 64 06/03/2020   Lab Results  Component Value Date   TRIG 238 (H) 06/03/2020   Lab Results  Component Value Date   CHOLHDL 2.5 06/03/2020   Lab Results  Component Value Date   HGBA1C 7.6 (H) 06/03/2020      Assessment & Plan:   Problem List Items Addressed This Visit       Cardiovascular and Mediastinum   Essential hypertension - Primary   Relevant  Medications   lisinopril-hydrochlorothiazide (ZESTORETIC) 10-12.5 MG tablet   Other Relevant Orders   CBC with Differential/Platelet   Comprehensive metabolic panel   TSH Continue current meds     Endocrine   Type 2 diabetes mellitus without complication, without long-term current use of insulin (HCC)   Relevant Medications   lisinopril-hydrochlorothiazide (ZESTORETIC) 10-12.5 MG tablet   Other Relevant Orders   CBC with Differential/Platelet   Comprehensive metabolic panel   TSH   Hemoglobin A1c Continue meds/watch diet     Other   Mixed hyperlipidemia   Relevant Medications   lisinopril-hydrochlorothiazide (ZESTORETIC) 10-12.5 MG tablet   Other Relevant Orders   Lipid panel Continue meds/watch diet    Meds ordered this encounter  Medications   lisinopril-hydrochlorothiazide (ZESTORETIC) 10-12.5 MG tablet    Sig: Take 1 tablet by mouth daily.    Dispense:  90 tablet    Refill:  1    Order Specific Question:   Supervising Provider    Answer:   Shelton Silvas    Follow-up: Return in about 6 months (around 09/16/2021) for chronic fasting follow up.    SARA R Kynadie Yaun, PA-C

## 2021-03-20 LAB — CBC WITH DIFFERENTIAL/PLATELET
Basophils Absolute: 0.1 10*3/uL (ref 0.0–0.2)
Basos: 1 %
EOS (ABSOLUTE): 0.1 10*3/uL (ref 0.0–0.4)
Eos: 2 %
Hematocrit: 38.7 % (ref 34.0–46.6)
Hemoglobin: 12.8 g/dL (ref 11.1–15.9)
Immature Grans (Abs): 0 10*3/uL (ref 0.0–0.1)
Immature Granulocytes: 1 %
Lymphocytes Absolute: 2.2 10*3/uL (ref 0.7–3.1)
Lymphs: 32 %
MCH: 28.7 pg (ref 26.6–33.0)
MCHC: 33.1 g/dL (ref 31.5–35.7)
MCV: 87 fL (ref 79–97)
Monocytes Absolute: 0.4 10*3/uL (ref 0.1–0.9)
Monocytes: 5 %
Neutrophils Absolute: 4.1 10*3/uL (ref 1.4–7.0)
Neutrophils: 59 %
Platelets: 361 10*3/uL (ref 150–450)
RBC: 4.46 x10E6/uL (ref 3.77–5.28)
RDW: 12.2 % (ref 11.7–15.4)
WBC: 6.9 10*3/uL (ref 3.4–10.8)

## 2021-03-20 LAB — CARDIOVASCULAR RISK ASSESSMENT

## 2021-03-20 LAB — LIPID PANEL
Chol/HDL Ratio: 3.4 ratio (ref 0.0–4.4)
Cholesterol, Total: 186 mg/dL (ref 100–199)
HDL: 54 mg/dL (ref >39)
LDL Chol Calc (NIH): 88 mg/dL (ref 0–99)
Triglycerides: 265 mg/dL — ABNORMAL HIGH (ref 0–149)
VLDL Cholesterol Cal: 44 mg/dL — ABNORMAL HIGH (ref 5–40)

## 2021-03-20 LAB — COMPREHENSIVE METABOLIC PANEL
ALT: 15 IU/L (ref 0–32)
AST: 18 IU/L (ref 0–40)
Albumin/Globulin Ratio: 1.8 (ref 1.2–2.2)
Albumin: 4.4 g/dL (ref 3.8–4.8)
Alkaline Phosphatase: 59 IU/L (ref 44–121)
BUN/Creatinine Ratio: 15 (ref 9–23)
BUN: 8 mg/dL (ref 6–24)
Bilirubin Total: 0.9 mg/dL (ref 0.0–1.2)
CO2: 20 mmol/L (ref 20–29)
Calcium: 9.6 mg/dL (ref 8.7–10.2)
Chloride: 99 mmol/L (ref 96–106)
Creatinine, Ser: 0.54 mg/dL — ABNORMAL LOW (ref 0.57–1.00)
Globulin, Total: 2.4 g/dL (ref 1.5–4.5)
Glucose: 148 mg/dL — ABNORMAL HIGH (ref 65–99)
Potassium: 3.9 mmol/L (ref 3.5–5.2)
Sodium: 139 mmol/L (ref 134–144)
Total Protein: 6.8 g/dL (ref 6.0–8.5)
eGFR: 118 mL/min/{1.73_m2} (ref >59)

## 2021-03-20 LAB — HEMOGLOBIN A1C
Est. average glucose Bld gHb Est-mCnc: 169 mg/dL
Hgb A1c MFr Bld: 7.5 % — ABNORMAL HIGH (ref 4.8–5.6)

## 2021-03-20 LAB — TSH: TSH: 2.08 u[IU]/mL (ref 0.450–4.500)

## 2021-03-26 ENCOUNTER — Ambulatory Visit: Payer: BC Managed Care – PPO | Admitting: Physician Assistant

## 2021-06-27 ENCOUNTER — Other Ambulatory Visit: Payer: Self-pay | Admitting: Physician Assistant

## 2021-06-27 DIAGNOSIS — I10 Essential (primary) hypertension: Secondary | ICD-10-CM

## 2021-06-27 DIAGNOSIS — E782 Mixed hyperlipidemia: Secondary | ICD-10-CM

## 2021-06-27 DIAGNOSIS — E119 Type 2 diabetes mellitus without complications: Secondary | ICD-10-CM

## 2021-07-07 ENCOUNTER — Ambulatory Visit (INDEPENDENT_AMBULATORY_CARE_PROVIDER_SITE_OTHER): Payer: BC Managed Care – PPO | Admitting: Physician Assistant

## 2021-07-07 ENCOUNTER — Encounter: Payer: Self-pay | Admitting: Physician Assistant

## 2021-07-07 VITALS — BP 128/76 | HR 84 | Temp 98.6°F | Ht 72.0 in | Wt 218.0 lb

## 2021-07-07 DIAGNOSIS — J101 Influenza due to other identified influenza virus with other respiratory manifestations: Secondary | ICD-10-CM

## 2021-07-07 DIAGNOSIS — R5383 Other fatigue: Secondary | ICD-10-CM | POA: Diagnosis not present

## 2021-07-07 LAB — POC COVID19 BINAXNOW: SARS Coronavirus 2 Ag: NEGATIVE

## 2021-07-07 LAB — POCT INFLUENZA A/B
Influenza A, POC: POSITIVE — AB
Influenza B, POC: NEGATIVE

## 2021-07-07 MED ORDER — PROMETHAZINE-DM 6.25-15 MG/5ML PO SYRP: 5.0000 mL | ORAL_SOLUTION | Freq: Four times a day (QID) | ORAL | 0 refills | Status: DC | PRN

## 2021-07-07 MED ORDER — OSELTAMIVIR PHOSPHATE 75 MG PO CAPS: 75.0000 mg | ORAL_CAPSULE | Freq: Two times a day (BID) | ORAL | 0 refills | Status: DC

## 2021-07-07 NOTE — Progress Notes (Signed)
Acute Office Visit  Subjective:    Patient ID: Monique [name], female    DOB: 1976-06-07, 45 y.o.   MRN: [id number]  Chief Complaint  Patient presents with   URI    Symptoms started Monday. Father tested positive last Friday reports being exposed to her father on Saturday. No fever/chills/N/V c/o headache, cough, PND, facial pain and pressure.    HPI: Patient is in today for complaints of fever, headache, malaise and cough and congestion for the past 2 days - has been exposed to flu  Past Medical History:  Diagnosis Date   Calculus of kidney    Essential (primary) hypertension    Irritable bowel syndrome with diarrhea    Mixed hyperlipidemia    Other microscopic hematuria    Right lower quadrant abdominal tenderness    Type 2 diabetes mellitus without complications ([place])     History reviewed. No pertinent surgical history.  History reviewed. No pertinent family history.  Social History   Socioeconomic History   Marital status: Married    Spouse name: Not on file   Number of children: Not on file   Years of education: Not on file   Highest education level: Not on file  Occupational History   Not on file  Tobacco Use   Smoking status: Never   Smokeless tobacco: Never  Vaping Use   Vaping Use: Never used  Substance and Sexual Activity   Alcohol use: Never   Drug use: Never   Sexual activity: Not on file  Other Topics Concern   Not on file  Social History Narrative   Not on file   Social Determinants of Health   Financial Resource Strain: Not on file  Food Insecurity: Not on file  Transportation Needs: Not on file  Physical Activity: Not on file  Stress: Not on file  Social Connections: Not on file  Intimate Partner Violence: Not on file    Outpatient Medications Prior to Visit  Medication Sig Dispense Refill   albuterol (VENTOLIN HFA) 108 (90 Base) MCG/ACT inhaler INHALE 1 - 2 PUFFS BY MOUTH EVERY 4 HOURS AS NEEDED 8.5 each 5   atorvastatin  (LIPITOR) 10 MG tablet TAKE 1 TABLET BY MOUTH EVERY DAY 90 tablet 1   dicyclomine (BENTYL) 10 MG capsule TAKE 1 CAPSULE BY MOUTH TWICE A DAY AS NEEDED FOR ABDOMNIMAL PAIN 180 capsule 2   EPINEPHrine 0.3 mg/0.3 mL IJ SOAJ injection Inject 0.3 mg into the muscle once.     levocetirizine (XYZAL) 5 MG tablet Take 5 mg by mouth every evening.     lisinopril-hydrochlorothiazide (ZESTORETIC) 10-12.5 MG tablet TAKE 1 TABLET BY MOUTH EVERY DAY 90 tablet 1   metFORMIN (GLUCOPHAGE) 1000 MG tablet TAKE 1 TABLET (1,000 MG TOTAL) BY MOUTH 2 (TWO) TIMES DAILY WITH A MEAL. 180 tablet 0   TRI-ESTARYLLA 0.18/0.215/0.25 MG-35 MCG tablet Take 1 tablet by mouth daily.     No facility-administered medications prior to visit.    Allergies  Allergen Reactions   Sulfa Antibiotics    Sulfamethoxazole Other (See Comments)    unknown    Review of Systems CONSTITUTIONAL: see HPI E/N/T: see HPI CARDIOVASCULAR: Negative for chest pain, dizziness, palpitations and pedal edema.  RESPIRATORY: see HPI GASTROINTESTINAL: Negative for abdominal pain, acid reflux symptoms, constipation, diarrhea, nausea and vomiting.  MSK: Negative for arthralgias and myalgias.  INTEGUMENTARY: Negative for rash.          Objective:    Physical Exam PHYSICAL EXAM:  VS: BP 128/76    Pulse 84    Temp 98.6 F (37 C)    Ht 6' (1.829 m)    Wt 218 lb (98.9 kg)    SpO2 99%    BMI 29.57 kg/m   GEN: Well nourished, well developed, in no acute distress  HEENT: normal external ears and nose - normal external auditory canals and TMS -  - Lips, Teeth and Gums - normal  Oropharynx - erythema/pnd Cardiac: RRR; no murmurs, rubs, or gallops,no edema -  Respiratory:  normal respiratory rate and pattern with no distress - normal breath sounds with no rales, rhonchi, wheezes or rubs Psych: euthymic mood, appropriate affect and demeanor Office Visit on 07/07/2021  Component Date Value Ref Range Status   Influenza A, POC 07/07/2021 Positive (A)   Negative Final   Influenza B, POC 07/07/2021 Negative  Negative Final   SARS Coronavirus 2 Ag 07/07/2021 Negative  Negative Final    BP 128/76    Pulse 84    Temp 98.6 F (37 C)    Ht 6' (1.829 m)    Wt 218 lb (98.9 kg)    SpO2 99%    BMI 29.57 kg/m  Wt Readings from Last 3 Encounters:  07/07/21 218 lb (98.9 kg)  03/19/21 224 lb (101.6 kg)  07/09/20 223 lb (101.2 kg)    Health Maintenance Due  Topic Date Due   Pneumococcal Vaccine 56-19 Years old (1 - PCV) Never done   PAP SMEAR-Modifier  Never done   OPHTHALMOLOGY EXAM  08/08/2020   COVID-19 Vaccine (4 - Booster for Pfizer series) 09/16/2020   FOOT EXAM  12/04/2020    There are no preventive care reminders to display for this patient.   Lab Results  Component Value Date   TSH 2.080 03/19/2021   Lab Results  Component Value Date   WBC 6.9 03/19/2021   HGB 12.8 03/19/2021   HCT 38.7 03/19/2021   MCV 87 03/19/2021   PLT 361 03/19/2021   Lab Results  Component Value Date   NA 139 03/19/2021   K 3.9 03/19/2021   CO2 20 03/19/2021   GLUCOSE 148 (H) 03/19/2021   BUN 8 03/19/2021   CREATININE 0.54 (L) 03/19/2021   BILITOT 0.9 03/19/2021   ALKPHOS 59 03/19/2021   AST 18 03/19/2021   ALT 15 03/19/2021   PROT 6.8 03/19/2021   ALBUMIN 4.4 03/19/2021   CALCIUM 9.6 03/19/2021   EGFR 118 03/19/2021   Lab Results  Component Value Date   CHOL 186 03/19/2021   Lab Results  Component Value Date   HDL 54 03/19/2021   Lab Results  Component Value Date   LDLCALC 88 03/19/2021   Lab Results  Component Value Date   TRIG 265 (H) 03/19/2021   Lab Results  Component Value Date   CHOLHDL 3.4 03/19/2021   Lab Results  Component Value Date   HGBA1C 7.5 (H) 03/19/2021       Assessment & Plan:   Problem List Items Addressed This Visit   None Visit Diagnoses     Other fatigue    -  Primary   Relevant Orders   POCT Influenza A/B (Completed)   POC COVID-19 BinaxNow (Completed)   Influenza A       Relevant  Medications   oseltamivir (TAMIFLU) 75 MG capsule   promethazine-dextromethorphan (PROMETHAZINE-DM) 6.25-15 MG/5ML syrup      Meds ordered this encounter  Medications   oseltamivir (TAMIFLU) 75 MG capsule  Sig: Take 1 capsule (75 mg total) by mouth 2 (two) times daily.    Dispense:  10 capsule    Refill:  0    Order Specific Question:   Supervising Provider    Answer:   Shelton Silvas   promethazine-dextromethorphan (PROMETHAZINE-DM) 6.25-15 MG/5ML syrup    Sig: Take 5 mLs by mouth 4 (four) times daily as needed for cough.    Dispense:  118 mL    Refill:  0    Order Specific Question:   Supervising Provider    AnswerShelton Silvas    Orders Placed This Encounter  Procedures   POCT Influenza A/B   POC COVID-19 BinaxNow     Follow-up: Return if symptoms worsen or fail to improve.  An After Visit Summary was printed and given to the patient.  Yetta Flock [facility] [phone]

## 2021-07-11 ENCOUNTER — Encounter: Payer: Self-pay | Admitting: Physician Assistant

## 2021-07-13 ENCOUNTER — Other Ambulatory Visit: Payer: Self-pay | Admitting: Physician Assistant

## 2021-07-13 MED ORDER — AMOXICILLIN 875 MG PO TABS: 875.0000 mg | ORAL_TABLET | Freq: Two times a day (BID) | ORAL | 0 refills | Status: AC

## 2021-07-26 ENCOUNTER — Other Ambulatory Visit: Payer: Self-pay | Admitting: Physician Assistant

## 2021-07-26 ENCOUNTER — Telehealth: Payer: Self-pay

## 2021-07-26 MED ORDER — FLUCONAZOLE 150 MG PO TABS: 150.0000 mg | ORAL_TABLET | Freq: Every day | ORAL | 1 refills | Status: DC

## 2021-07-26 NOTE — Telephone Encounter (Signed)
Made pt aware.   Monique [name] 07/26/21 2:53 PM

## 2021-07-26 NOTE — Telephone Encounter (Signed)
Will send in rx.

## 2021-07-26 NOTE — Telephone Encounter (Signed)
Patient left VM. She finished antibiotics over weekend. She now has yeast infection. Requesting medication for this.   Terrill Mohr 07/26/21 2:49 PM

## 2021-09-03 ENCOUNTER — Other Ambulatory Visit: Payer: Self-pay | Admitting: Physician Assistant

## 2021-09-03 MED ORDER — BENZONATATE 200 MG PO CAPS: 200.0000 mg | ORAL_CAPSULE | Freq: Two times a day (BID) | ORAL | 0 refills | Status: DC | PRN

## 2021-09-17 ENCOUNTER — Ambulatory Visit: Payer: BC Managed Care – PPO | Admitting: Physician Assistant

## 2021-09-17 ENCOUNTER — Encounter: Payer: Self-pay | Admitting: Physician Assistant

## 2021-09-17 ENCOUNTER — Other Ambulatory Visit: Payer: Self-pay

## 2021-09-17 ENCOUNTER — Other Ambulatory Visit: Payer: Self-pay | Admitting: Physician Assistant

## 2021-09-17 VITALS — BP 122/72 | HR 85 | Temp 97.9°F | Ht 72.0 in | Wt 222.6 lb

## 2021-09-17 DIAGNOSIS — E119 Type 2 diabetes mellitus without complications: Secondary | ICD-10-CM

## 2021-09-17 DIAGNOSIS — E559 Vitamin D deficiency, unspecified: Secondary | ICD-10-CM | POA: Diagnosis not present

## 2021-09-17 DIAGNOSIS — K588 Other irritable bowel syndrome: Secondary | ICD-10-CM

## 2021-09-17 DIAGNOSIS — E782 Mixed hyperlipidemia: Secondary | ICD-10-CM

## 2021-09-17 DIAGNOSIS — I10 Essential (primary) hypertension: Secondary | ICD-10-CM

## 2021-09-17 NOTE — Progress Notes (Signed)
? ?Subjective:  ?Patient ID: Monique [name], female    DOB: 1975/08/20  Age: 46 y.o. MRN: [id number] ? ?Chief Complaint  ?Patient presents with  ? Diabetes  ? Hypertension  ? ? ?HPI ? Pt presents for follow up of hypertension.  The patient is tolerating the medication well without side effects. Compliance with treatment has been good; including taking medication as directed , maintains a healthy diet and regular exercise regimen , and following up as directed.currently on zestoretic 10/12.5mg  qd ? ?Mixed hyperlipidemia  ?Pt presents with hyperlipidemia. Compliance with treatment has been good. The patient is compliant with medications, maintains a low cholesterol diet , follows up as directed , and maintains an exercise regimen . The patient denies experiencing any hypercholesterolemia related symptoms. Currently taking lipitor 10mg  qd ? ?Monique [name] has a history of type 2 Diabetes Mellitus for  2 years. Current treatment includes glucophage 1000mg  bid - Her last hgb a1c was 7.5 and she is now agreeable to adding in other medication if labwork shows elevated a1c-   She denies hypoglycemic episodes. . She  is consuming a heart healthy diet and exercising regularly. She is scheduled for eye exam this summer ? ?Pt with history of IBS and doing well on bentyl ? ?Lab Results  ?Component Value Date  ? HGBA1C 7.5 (H) 03/19/2021  ? HGBA1C 7.6 (H) 06/03/2020  ? HGBA1C 7.3 (H) 12/05/2019  ? ? ? ? ?Current Outpatient Medications on File Prior to Visit  ?Medication Sig Dispense Refill  ? albuterol (VENTOLIN HFA) 108 (90 Base) MCG/ACT inhaler INHALE 1 - 2 PUFFS BY MOUTH EVERY 4 HOURS AS NEEDED 8.5 each 5  ? atorvastatin (LIPITOR) 10 MG tablet TAKE 1 TABLET BY MOUTH EVERY DAY 90 tablet 1  ? dicyclomine (BENTYL) 10 MG capsule TAKE 1 CAPSULE BY MOUTH TWICE A DAY AS NEEDED FOR ABDOMNIMAL PAIN 180 capsule 2  ? EPINEPHrine 0.3 mg/0.3 mL IJ SOAJ injection Inject 0.3 mg into the muscle once.    ? levocetirizine (XYZAL) 5 MG tablet  Take 5 mg by mouth every evening.    ? lisinopril-hydrochlorothiazide (ZESTORETIC) 10-12.5 MG tablet TAKE 1 TABLET BY MOUTH EVERY DAY 90 tablet 1  ? metFORMIN (GLUCOPHAGE) 1000 MG tablet TAKE 1 TABLET (1,000 MG TOTAL) BY MOUTH 2 (TWO) TIMES DAILY WITH A MEAL. 180 tablet 0  ? Omega-3 Fatty Acids (FISH OIL) 1000 MG CAPS Take by mouth.    ? TRI-ESTARYLLA 0.18/0.215/0.25 MG-35 MCG tablet Take 1 tablet by mouth daily.    ? ?No current facility-administered medications on file prior to visit.  ? ?Past Medical History:  ?Diagnosis Date  ? Calculus of kidney   ? Essential (primary) hypertension   ? Irritable bowel syndrome with diarrhea   ? Mixed hyperlipidemia   ? Other microscopic hematuria   ? Right lower quadrant abdominal tenderness   ? Type 2 diabetes mellitus without complications (HCC)   ? ?History reviewed. No pertinent surgical history.  ?History reviewed. No pertinent family history. ?Social History  ? ?Socioeconomic History  ? Marital status: Married  ?  Spouse name: Not on file  ? Number of children: Not on file  ? Years of education: Not on file  ? Highest education level: Not on file  ?Occupational History  ? Not on file  ?Tobacco Use  ? Smoking status: Never  ? Smokeless tobacco: Never  ?Vaping Use  ? Vaping Use: Never used  ?Substance and Sexual Activity  ? Alcohol use: Never  ?  Drug use: Never  ? Sexual activity: Not on file  ?Other Topics Concern  ? Not on file  ?Social History Narrative  ? Not on file  ? ?Social Determinants of Health  ? ?Financial Resource Strain: Not on file  ?Food Insecurity: Not on file  ?Transportation Needs: Not on file  ?Physical Activity: Not on file  ?Stress: Not on file  ?Social Connections: Not on file  ? ? ?Review of Systems ?CONSTITUTIONAL: Negative for chills, fatigue, fever, unintentional weight gain and unintentional weight loss.  ?E/N/T: Negative for ear pain, nasal congestion and sore throat.  ?CARDIOVASCULAR: Negative for chest pain, dizziness, palpitations and pedal  edema.  ?RESPIRATORY: Negative for recent cough and dyspnea.  ?GASTROINTESTINAL: Negative for abdominal pain, acid reflux symptoms, constipation, diarrhea, nausea and vomiting.  ?MSK: Negative for arthralgias and myalgias.  ?INTEGUMENTARY: Negative for rash.  ?NEUROLOGICAL: Negative for dizziness and headaches.  ?PSYCHIATRIC: Negative for sleep disturbance and to question depression screen.  Negative for depression, negative for anhedonia.  ?   ? ? ?Objective:  ?PHYSICAL EXAM:  ? ?VS: BP 122/72   Pulse 85   Temp 97.9 ?F (36.6 ?C)   Ht 6' (1.829 m)   Wt 222 lb 9.6 oz (101 kg)   SpO2 98%   BMI 30.19 kg/m?  ? ?GEN: Well nourished, well developed, in no acute distress  ?Cardiac: RRR; no murmurs, rubs, or gallops,no edema - ?Respiratory:  normal respiratory rate and pattern with no distress - normal breath sounds with no rales, rhonchi, wheezes or rubs ?GI: normal bowel sounds, no masses or tenderness ?Skin: warm and dry, no rash  ?Psych: euthymic mood, appropriate affect and demeanor ? ? ?Diabetic Foot Exam - Simple   ?Simple Foot Form ?Diabetic Foot exam was performed with the following findings: Yes 09/17/2021 10:00 AM  ?Visual Inspection ?No deformities, no ulcerations, no other skin breakdown bilaterally: Yes ?Sensation Testing ?Intact to touch and monofilament testing bilaterally: Yes ?Pulse Check ?Posterior Tibialis and Dorsalis pulse intact bilaterally: Yes ?Comments ?  ?  ? ?Lab Results  ?Component Value Date  ? WBC 6.9 03/19/2021  ? HGB 12.8 03/19/2021  ? HCT 38.7 03/19/2021  ? PLT 361 03/19/2021  ? GLUCOSE 148 (H) 03/19/2021  ? CHOL 186 03/19/2021  ? TRIG 265 (H) 03/19/2021  ? HDL 54 03/19/2021  ? LDLCALC 88 03/19/2021  ? ALT 15 03/19/2021  ? AST 18 03/19/2021  ? NA 139 03/19/2021  ? K 3.9 03/19/2021  ? CL 99 03/19/2021  ? CREATININE 0.54 (L) 03/19/2021  ? BUN 8 03/19/2021  ? CO2 20 03/19/2021  ? TSH 2.080 03/19/2021  ? HGBA1C 7.5 (H) 03/19/2021  ? MICROALBUR negative 12/05/2019  ? ? ? ? ?Assessment &  Plan:  ? ?Problem List Items Addressed This Visit   ? ?  ? Cardiovascular and Mediastinum  ? Essential hypertension - Primary  ? Relevant Orders  ? CBC with Differential/Platelet  ? Comprehensive metabolic panel  ? TSH  ?  ? Endocrine  ? Type 2 diabetes mellitus without complication, without long-term current use of insulin (HCC)  ? Relevant Orders  ? CBC with Differential/Platelet  ? Comprehensive metabolic panel  ? TSH  ? Hemoglobin A1c  ? VITAMIN D 25 Hydroxy (Vit-D Deficiency, Fractures)  ? Microalbumin / creatinine urine ratio  ?  ? Other  ? Mixed hyperlipidemia  ? Relevant Orders  ? Lipid panel  ? ?Other Visit Diagnoses   ? ? Mild vitamin D deficiency      ?  Relevant Orders  ? VITAMIN D 25 Hydroxy (Vit-D Deficiency, Fractures)  ? Other irritable bowel syndrome     ?Continue meds as directed  ? ?  ?. ? ?No orders of the defined types were placed in this encounter. ? ? ?Orders Placed This Encounter  ?Procedures  ? CBC with Differential/Platelet  ? Comprehensive metabolic panel  ? TSH  ? Lipid panel  ? Hemoglobin A1c  ? VITAMIN D 25 Hydroxy (Vit-D Deficiency, Fractures)  ? Microalbumin / creatinine urine ratio  ?  ? ?Follow-up: Return in about 3 months (around 12/18/2021) for chronic fasting follow up. ? ?An After Visit Summary was printed and given to the patient. ? ?SARA R Charlesetta Milliron, PA-C ?[facility] ?[phone]

## 2021-09-18 LAB — HEMOGLOBIN A1C
Est. average glucose Bld gHb Est-mCnc: 157 mg/dL
Hgb A1c MFr Bld: 7.1 % — ABNORMAL HIGH (ref 4.8–5.6)

## 2021-09-18 LAB — LIPID PANEL
Chol/HDL Ratio: 2.8 ratio (ref 0.0–4.4)
Cholesterol, Total: 191 mg/dL (ref 100–199)
HDL: 69 mg/dL (ref >39)
LDL Chol Calc (NIH): 62 mg/dL (ref 0–99)
Triglycerides: 399 mg/dL — ABNORMAL HIGH (ref 0–149)
VLDL Cholesterol Cal: 60 mg/dL — ABNORMAL HIGH (ref 5–40)

## 2021-09-18 LAB — COMPREHENSIVE METABOLIC PANEL
ALT: 8 IU/L (ref 0–32)
AST: 14 IU/L (ref 0–40)
Albumin/Globulin Ratio: 1.6 (ref 1.2–2.2)
Albumin: 3.9 g/dL (ref 3.8–4.8)
Alkaline Phosphatase: 43 IU/L — ABNORMAL LOW (ref 44–121)
BUN/Creatinine Ratio: 20 (ref 9–23)
BUN: 11 mg/dL (ref 6–24)
Bilirubin Total: 0.6 mg/dL (ref 0.0–1.2)
CO2: 23 mmol/L (ref 20–29)
Calcium: 9.5 mg/dL (ref 8.7–10.2)
Chloride: 98 mmol/L (ref 96–106)
Creatinine, Ser: 0.54 mg/dL — ABNORMAL LOW (ref 0.57–1.00)
Globulin, Total: 2.4 g/dL (ref 1.5–4.5)
Glucose: 174 mg/dL — ABNORMAL HIGH (ref 70–99)
Potassium: 4.3 mmol/L (ref 3.5–5.2)
Sodium: 136 mmol/L (ref 134–144)
Total Protein: 6.3 g/dL (ref 6.0–8.5)
eGFR: 117 mL/min/{1.73_m2} (ref >59)

## 2021-09-18 LAB — MICROALBUMIN / CREATININE URINE RATIO
Creatinine, Urine: 75.6 mg/dL
Microalb/Creat Ratio: <4 mg/g creat (ref 0–29)
Microalbumin, Urine: <3 ug/mL

## 2021-09-18 LAB — VITAMIN D 25 HYDROXY (VIT D DEFICIENCY, FRACTURES): Vit D, 25-Hydroxy: 39.7 ng/mL (ref 30.0–100.0)

## 2021-09-18 LAB — TSH: TSH: 2.23 u[IU]/mL (ref 0.450–4.500)

## 2021-09-18 LAB — CBC WITH DIFFERENTIAL/PLATELET
Basophils Absolute: 0 10*3/uL (ref 0.0–0.2)
Basos: 1 %
EOS (ABSOLUTE): 0.2 10*3/uL (ref 0.0–0.4)
Eos: 2 %
Hematocrit: 40.2 % (ref 34.0–46.6)
Hemoglobin: 13.5 g/dL (ref 11.1–15.9)
Immature Grans (Abs): 0 10*3/uL (ref 0.0–0.1)
Immature Granulocytes: 0 %
Lymphocytes Absolute: 1.9 10*3/uL (ref 0.7–3.1)
Lymphs: 28 %
MCH: 29.8 pg (ref 26.6–33.0)
MCHC: 33.6 g/dL (ref 31.5–35.7)
MCV: 89 fL (ref 79–97)
Monocytes Absolute: 0.4 10*3/uL (ref 0.1–0.9)
Monocytes: 6 %
Neutrophils Absolute: 4.4 10*3/uL (ref 1.4–7.0)
Neutrophils: 63 %
Platelets: 356 10*3/uL (ref 150–450)
RBC: 4.53 x10E6/uL (ref 3.77–5.28)
RDW: 12.2 % (ref 11.7–15.4)
WBC: 7 10*3/uL (ref 3.4–10.8)

## 2021-09-18 LAB — CARDIOVASCULAR RISK ASSESSMENT

## 2021-09-21 ENCOUNTER — Other Ambulatory Visit: Payer: Self-pay | Admitting: Physician Assistant

## 2021-09-21 DIAGNOSIS — E119 Type 2 diabetes mellitus without complications: Secondary | ICD-10-CM

## 2021-09-21 DIAGNOSIS — E782 Mixed hyperlipidemia: Secondary | ICD-10-CM

## 2021-09-21 MED ORDER — ATORVASTATIN CALCIUM 20 MG PO TABS: 20.0000 mg | ORAL_TABLET | Freq: Every day | ORAL | 1 refills | Status: DC

## 2021-09-21 MED ORDER — OZEMPIC (0.25 OR 0.5 MG/DOSE) 2 MG/1.5ML ~~LOC~~ SOPN: PEN_INJECTOR | SUBCUTANEOUS | 0 refills | Status: DC

## 2021-11-28 ENCOUNTER — Other Ambulatory Visit: Payer: Self-pay | Admitting: Physician Assistant

## 2021-11-28 DIAGNOSIS — E119 Type 2 diabetes mellitus without complications: Secondary | ICD-10-CM

## 2022-01-02 ENCOUNTER — Other Ambulatory Visit: Payer: Self-pay | Admitting: Physician Assistant

## 2022-01-02 DIAGNOSIS — I10 Essential (primary) hypertension: Secondary | ICD-10-CM

## 2022-01-03 ENCOUNTER — Ambulatory Visit: Payer: BC Managed Care – PPO | Admitting: Physician Assistant

## 2022-01-31 ENCOUNTER — Other Ambulatory Visit: Payer: Self-pay | Admitting: Physician Assistant

## 2022-02-02 ENCOUNTER — Ambulatory Visit: Payer: BC Managed Care – PPO | Admitting: Physician Assistant

## 2022-02-28 ENCOUNTER — Encounter: Payer: Self-pay | Admitting: Physician Assistant

## 2022-02-28 ENCOUNTER — Ambulatory Visit: Payer: BC Managed Care – PPO | Admitting: Physician Assistant

## 2022-02-28 VITALS — BP 110/68 | HR 81 | Temp 97.3°F | Ht 72.0 in | Wt 226.6 lb

## 2022-02-28 DIAGNOSIS — E559 Vitamin D deficiency, unspecified: Secondary | ICD-10-CM | POA: Diagnosis not present

## 2022-02-28 DIAGNOSIS — E782 Mixed hyperlipidemia: Secondary | ICD-10-CM | POA: Diagnosis not present

## 2022-02-28 DIAGNOSIS — E119 Type 2 diabetes mellitus without complications: Secondary | ICD-10-CM | POA: Diagnosis not present

## 2022-02-28 DIAGNOSIS — I10 Essential (primary) hypertension: Secondary | ICD-10-CM

## 2022-02-28 NOTE — Progress Notes (Signed)
Subjective:  Patient ID: Monique [name], female    DOB: 01-27-1976  Age: 46 y.o. MRN: [id number]  Chief Complaint  Patient presents with   Hypertension   Diabetes    HPI  Pt presents for follow up of hypertension. The patient is tolerating the medication well without side effects. Compliance with treatment has been good; including taking medication as directed , maintains a healthy diet and regular exercise regimen , and following up as directed. Currently taking zestoretic 10/12.5mg  qd  Mixed hyperlipidemia  Pt presents with hyperlipidemia. Compliance with treatment has been good - The patient is compliant with medications, maintains a low cholesterol diet , follows up as directed , and maintains an exercise regimen . The patient denies experiencing any hypercholesterolemia related symptoms. Currently taking fish oil and lipitor 20mg  qd  Pt with history of NIDDM - she is currently taking glucophage 1000mg  - she never picked up the ozempic to start (she forgot) - is trying to watch her diet   Current Outpatient Medications on File Prior to Visit  Medication Sig Dispense Refill   albuterol (VENTOLIN HFA) 108 (90 Base) MCG/ACT inhaler INHALE 1 - 2 PUFFS BY MOUTH EVERY 4 HOURS AS NEEDED 8.5 each 5   atorvastatin (LIPITOR) 20 MG tablet Take 1 tablet (20 mg total) by mouth daily. 90 tablet 1   dicyclomine (BENTYL) 10 MG capsule TAKE 1 CAPSULE BY MOUTH TWICE A DAY AS NEEDED FOR ABDOMNIMAL PAIN 180 capsule 2   EPINEPHrine 0.3 mg/0.3 mL IJ SOAJ injection Inject 0.3 mg into the muscle once.     levocetirizine (XYZAL) 5 MG tablet Take 5 mg by mouth every evening.     lisinopril-hydrochlorothiazide (ZESTORETIC) 10-12.5 MG tablet TAKE 1 TABLET BY MOUTH EVERY DAY 90 tablet 1   metFORMIN (GLUCOPHAGE) 1000 MG tablet TAKE 1 TABLET (1,000 MG TOTAL) BY MOUTH TWICE A DAY WITH FOOD 180 tablet 0   Omega-3 Fatty Acids (FISH OIL) 1000 MG CAPS Take by mouth 2 (two) times daily.     TRI-ESTARYLLA  0.18/0.215/0.25 MG-35 MCG tablet Take 1 tablet by mouth daily.     No current facility-administered medications on file prior to visit.   Past Medical History:  Diagnosis Date   Calculus of kidney    Essential (primary) hypertension    Irritable bowel syndrome with diarrhea    Mixed hyperlipidemia    Other microscopic hematuria    Right lower quadrant abdominal tenderness    Type 2 diabetes mellitus without complications (HCC)    History reviewed. No pertinent surgical history.  History reviewed. No pertinent family history. Social History   Socioeconomic History   Marital status: Married    Spouse name: Not on file   Number of children: Not on file   Years of education: Not on file   Highest education level: Not on file  Occupational History   Not on file  Tobacco Use   Smoking status: Never   Smokeless tobacco: Never  Vaping Use   Vaping Use: Never used  Substance and Sexual Activity   Alcohol use: Never   Drug use: Never   Sexual activity: Not on file  Other Topics Concern   Not on file  Social History Narrative   Not on file   Social Determinants of Health   Financial Resource Strain: Not on file  Food Insecurity: Not on file  Transportation Needs: Not on file  Physical Activity: Not on file  Stress: Not on file  Social Connections: Not on  file    Review of Systems CONSTITUTIONAL: Negative for chills, fatigue, fever, unintentional weight gain and unintentional weight loss.  E/N/T: Negative for ear pain, nasal congestion and sore throat.  CARDIOVASCULAR: Negative for chest pain, dizziness, palpitations and pedal edema.  RESPIRATORY: Negative for recent cough and dyspnea.  GASTROINTESTINAL: Negative for abdominal pain, acid reflux symptoms, constipation, diarrhea, nausea and vomiting.  MSK: Negative for arthralgias and myalgias.  INTEGUMENTARY: Negative for rash.  NEUROLOGICAL: Negative for dizziness and headaches.  PSYCHIATRIC: Negative for sleep  disturbance and to question depression screen.  Negative for depression, negative for anhedonia.       Objective:  PHYSICAL EXAM:   VS: BP 110/68 (BP Location: Left Arm, Patient Position: Sitting, Cuff Size: Normal)   Pulse 81   Temp (!) 97.3 F (36.3 C) (Temporal)   Ht 6' (1.829 m)   Wt 226 lb 9.6 oz (102.8 kg)   SpO2 95%   BMI 30.73 kg/m   GEN: Well nourished, well developed, in no acute distress   Cardiac: RRR; no murmurs, rubs, or gallops,no edema -  Respiratory:  normal respiratory rate and pattern with no distress - normal breath sounds with no rales, rhonchi, wheezes or rubs  MS: no deformity or atrophy  Skin: warm and dry, no rash   Psych: euthymic mood, appropriate affect and demeanor  Lab Results  Component Value Date   WBC 7.0 09/17/2021   HGB 13.5 09/17/2021   HCT 40.2 09/17/2021   PLT 356 09/17/2021   GLUCOSE 174 (H) 09/17/2021   CHOL 191 09/17/2021   TRIG 399 (H) 09/17/2021   HDL 69 09/17/2021   LDLCALC 62 09/17/2021   ALT 8 09/17/2021   AST 14 09/17/2021   NA 136 09/17/2021   K 4.3 09/17/2021   CL 98 09/17/2021   CREATININE 0.54 (L) 09/17/2021   BUN 11 09/17/2021   CO2 23 09/17/2021   TSH 2.230 09/17/2021   HGBA1C 7.1 (H) 09/17/2021   MICROALBUR negative 12/05/2019      Assessment & Plan:   Problem List Items Addressed This Visit       Cardiovascular and Mediastinum   Essential hypertension - Primary   Relevant Orders   CBC with Differential/Platelet   Comprehensive metabolic panel   TSH Continue meds      Endocrine   Type 2 diabetes mellitus without complication, without long-term current use of insulin (HCC)   Relevant Orders   CBC with Differential/Platelet   Comprehensive metabolic panel   Hemoglobin A1c   TSH Continue current med and watch diet     Other   Mixed hyperlipidemia   Relevant Orders   Lipid panel Continue crestor and fish oil Low fat / low chol diet   Other Visit Diagnoses     Mild vitamin D  deficiency       Relevant Orders   VITAMIN D 25 Hydroxy (Vit-D Deficiency, Fractures)     .  No orders of the defined types were placed in this encounter.   Orders Placed This Encounter  Procedures   CBC with Differential/Platelet   Comprehensive metabolic panel   Lipid panel   Hemoglobin A1c   VITAMIN D 25 Hydroxy (Vit-D Deficiency, Fractures)   TSH     Follow-up: Return in about 4 months (around 06/30/2022) for chronic fasting follow up.  An After Visit Summary was printed and given to the patient.  Jettie Pagan [facility] [phone]

## 2022-03-01 LAB — COMPREHENSIVE METABOLIC PANEL
ALT: 9 IU/L (ref 0–32)
AST: 10 IU/L (ref 0–40)
Albumin/Globulin Ratio: 1.3 (ref 1.2–2.2)
Albumin: 3.7 g/dL — ABNORMAL LOW (ref 3.9–4.9)
Alkaline Phosphatase: 48 IU/L (ref 44–121)
BUN/Creatinine Ratio: 18 (ref 9–23)
BUN: 10 mg/dL (ref 6–24)
Bilirubin Total: 0.4 mg/dL (ref 0.0–1.2)
CO2: 21 mmol/L (ref 20–29)
Calcium: 9.1 mg/dL (ref 8.7–10.2)
Chloride: 100 mmol/L (ref 96–106)
Creatinine, Ser: 0.57 mg/dL (ref 0.57–1.00)
Globulin, Total: 2.8 g/dL (ref 1.5–4.5)
Glucose: 172 mg/dL — ABNORMAL HIGH (ref 70–99)
Potassium: 3.9 mmol/L (ref 3.5–5.2)
Sodium: 138 mmol/L (ref 134–144)
Total Protein: 6.5 g/dL (ref 6.0–8.5)
eGFR: 116 mL/min/{1.73_m2} (ref >59)

## 2022-03-01 LAB — HEMOGLOBIN A1C
Est. average glucose Bld gHb Est-mCnc: 166 mg/dL
Hgb A1c MFr Bld: 7.4 % — ABNORMAL HIGH (ref 4.8–5.6)

## 2022-03-01 LAB — LIPID PANEL
Chol/HDL Ratio: 2.1 ratio (ref 0.0–4.4)
Cholesterol, Total: 144 mg/dL (ref 100–199)
HDL: 67 mg/dL (ref >39)
LDL Chol Calc (NIH): 48 mg/dL (ref 0–99)
Triglycerides: 176 mg/dL — ABNORMAL HIGH (ref 0–149)
VLDL Cholesterol Cal: 29 mg/dL (ref 5–40)

## 2022-03-01 LAB — CBC WITH DIFFERENTIAL/PLATELET
Basophils Absolute: 0 10*3/uL (ref 0.0–0.2)
Basos: 0 %
EOS (ABSOLUTE): 0.2 10*3/uL (ref 0.0–0.4)
Eos: 2 %
Hematocrit: 40 % (ref 34.0–46.6)
Hemoglobin: 12.8 g/dL (ref 11.1–15.9)
Immature Grans (Abs): 0 10*3/uL (ref 0.0–0.1)
Immature Granulocytes: 0 %
Lymphocytes Absolute: 2 10*3/uL (ref 0.7–3.1)
Lymphs: 29 %
MCH: 28.6 pg (ref 26.6–33.0)
MCHC: 32 g/dL (ref 31.5–35.7)
MCV: 89 fL (ref 79–97)
Monocytes Absolute: 0.4 10*3/uL (ref 0.1–0.9)
Monocytes: 6 %
Neutrophils Absolute: 4.3 10*3/uL (ref 1.4–7.0)
Neutrophils: 63 %
Platelets: 347 10*3/uL (ref 150–450)
RBC: 4.48 x10E6/uL (ref 3.77–5.28)
RDW: 12.5 % (ref 11.7–15.4)
WBC: 7 10*3/uL (ref 3.4–10.8)

## 2022-03-01 LAB — TSH: TSH: 1.51 u[IU]/mL (ref 0.450–4.500)

## 2022-03-01 LAB — CARDIOVASCULAR RISK ASSESSMENT

## 2022-03-01 LAB — VITAMIN D 25 HYDROXY (VIT D DEFICIENCY, FRACTURES): Vit D, 25-Hydroxy: 50 ng/mL (ref 30.0–100.0)

## 2022-03-31 ENCOUNTER — Other Ambulatory Visit: Payer: Self-pay | Admitting: Physician Assistant

## 2022-03-31 DIAGNOSIS — E782 Mixed hyperlipidemia: Secondary | ICD-10-CM

## 2022-04-04 ENCOUNTER — Other Ambulatory Visit: Payer: Self-pay | Admitting: Physician Assistant

## 2022-04-04 DIAGNOSIS — E119 Type 2 diabetes mellitus without complications: Secondary | ICD-10-CM

## 2022-04-05 ENCOUNTER — Other Ambulatory Visit: Payer: Self-pay | Admitting: Family Medicine

## 2022-04-05 DIAGNOSIS — E119 Type 2 diabetes mellitus without complications: Secondary | ICD-10-CM

## 2022-07-01 ENCOUNTER — Other Ambulatory Visit: Payer: Self-pay | Admitting: Physician Assistant

## 2022-07-01 DIAGNOSIS — E119 Type 2 diabetes mellitus without complications: Secondary | ICD-10-CM

## 2022-07-13 ENCOUNTER — Ambulatory Visit: Payer: BC Managed Care – PPO | Admitting: Physician Assistant

## 2022-07-26 ENCOUNTER — Telehealth: Payer: Self-pay

## 2022-07-26 NOTE — Telephone Encounter (Signed)
I left a message on the number(s) listed in the patients chart requesting the patient to call back regarding the upcomming appointment for 08/01/2022. The provider is out of the office that day. The appointment has been canceled. Waiting for the patient to return the call.   

## 2022-07-26 NOTE — Telephone Encounter (Signed)
Appointment has been rescheduled.

## 2022-08-01 ENCOUNTER — Ambulatory Visit: Payer: BC Managed Care – PPO | Admitting: Physician Assistant

## 2022-08-15 ENCOUNTER — Ambulatory Visit: Payer: BC Managed Care – PPO | Admitting: Physician Assistant

## 2022-08-22 ENCOUNTER — Encounter: Payer: Self-pay | Admitting: Physician Assistant

## 2022-08-22 ENCOUNTER — Ambulatory Visit: Payer: BC Managed Care – PPO | Admitting: Physician Assistant

## 2022-08-22 VITALS — BP 128/70 | HR 83 | Temp 97.2°F | Ht 72.0 in | Wt 226.8 lb

## 2022-08-22 DIAGNOSIS — L309 Dermatitis, unspecified: Secondary | ICD-10-CM | POA: Diagnosis not present

## 2022-08-22 DIAGNOSIS — E782 Mixed hyperlipidemia: Secondary | ICD-10-CM

## 2022-08-22 DIAGNOSIS — E119 Type 2 diabetes mellitus without complications: Secondary | ICD-10-CM

## 2022-08-22 DIAGNOSIS — I10 Essential (primary) hypertension: Secondary | ICD-10-CM | POA: Diagnosis not present

## 2022-08-22 MED ORDER — TRIAMCINOLONE ACETONIDE 0.1 % EX CREA: 1.0000 | TOPICAL_CREAM | Freq: Two times a day (BID) | CUTANEOUS | 1 refills | Status: DC

## 2022-08-22 NOTE — Progress Notes (Signed)
Subjective:  Patient ID: Monique [name], female    DOB: 08-May-1976  Age: 47 y.o. MRN: [id number]  Chief Complaint  Patient presents with   Hypertension    Hypertension    Pt presents for follow up of hypertension. The patient is tolerating the medication well without side effects. Compliance with treatment has been good; including taking medication as directed , maintains a healthy diet and regular exercise regimen , and following up as directed. Currently taking zestoretic 10/12.5mg  qd  Mixed hyperlipidemia  Pt presents with hyperlipidemia. Compliance with treatment has been good - The patient is compliant with medications, maintains a low cholesterol diet , follows up as directed , and maintains an exercise regimen . The patient denies experiencing any hypercholesterolemia related symptoms. Currently taking fish oil bid and lipitor 20mg  qd  Pt with history of NIDDM - she is currently taking glucophage 1000mg  bid -- at this time she is not taking ozempic.  She had nausea with it and stopped it - will consider to try again at another time   Current Outpatient Medications on File Prior to Visit  Medication Sig Dispense Refill   albuterol (VENTOLIN HFA) 108 (90 Base) MCG/ACT inhaler INHALE 1 - 2 PUFFS BY MOUTH EVERY 4 HOURS AS NEEDED 8.5 each 5   atorvastatin (LIPITOR) 20 MG tablet TAKE 1 TABLET BY MOUTH EVERY DAY 90 tablet 1   dicyclomine (BENTYL) 10 MG capsule TAKE 1 CAPSULE BY MOUTH TWICE A DAY AS NEEDED FOR ABDOMNIMAL PAIN 180 capsule 2   EPINEPHrine 0.3 mg/0.3 mL IJ SOAJ injection Inject 0.3 mg into the muscle once.     levocetirizine (XYZAL) 5 MG tablet Take 5 mg by mouth every evening.     lisinopril-hydrochlorothiazide (ZESTORETIC) 10-12.5 MG tablet TAKE 1 TABLET BY MOUTH EVERY DAY 90 tablet 1   metFORMIN (GLUCOPHAGE) 1000 MG tablet TAKE 1 TABLET (1,000 MG TOTAL) BY MOUTH TWICE A DAY WITH FOOD 180 tablet 0   Omega-3 Fatty Acids (FISH OIL) 1000 MG CAPS Take by mouth 2 (two) times  daily.     TRI-ESTARYLLA 0.18/0.215/0.25 MG-35 MCG tablet Take 1 tablet by mouth daily.     No current facility-administered medications on file prior to visit.   Past Medical History:  Diagnosis Date   Calculus of kidney    Essential (primary) hypertension    Irritable bowel syndrome with diarrhea    Mixed hyperlipidemia    Other microscopic hematuria    Right lower quadrant abdominal tenderness    Type 2 diabetes mellitus without complications ([place])    History reviewed. No pertinent surgical history.  History reviewed. No pertinent family history. Social History   Socioeconomic History   Marital status: Married    Spouse name: Not on file   Number of children: Not on file   Years of education: Not on file   Highest education level: Not on file  Occupational History   Not on file  Tobacco Use   Smoking status: Never   Smokeless tobacco: Never  Vaping Use   Vaping Use: Never used  Substance and Sexual Activity   Alcohol use: Never   Drug use: Never   Sexual activity: Not on file  Other Topics Concern   Not on file  Social History Narrative   Not on file   Social Determinants of Health   Financial Resource Strain: Not on file  Food Insecurity: Not on file  Transportation Needs: Not on file  Physical Activity: Not on file  Stress:  Not on file  Social Connections: Not on file   CONSTITUTIONAL: Negative for chills, fatigue, fever, unintentional weight gain and unintentional weight loss.  E/N/T: Negative for ear pain, nasal congestion and sore throat.  CARDIOVASCULAR: Negative for chest pain, dizziness, palpitations and pedal edema.  RESPIRATORY: Negative for recent cough and dyspnea.  GASTROINTESTINAL: Negative for abdominal pain, acid reflux symptoms, constipation, diarrhea, nausea and vomiting.  MSK: Negative for arthralgias and myalgias.  INTEGUMENTARY: pt does have slight red rash on sides of neck NEUROLOGICAL: Negative for dizziness and headaches.   PSYCHIATRIC: Negative for sleep disturbance and to question depression screen.  Negative for depression, negative for anhedonia.       Objective:  PHYSICAL EXAM:   VS: BP 128/70   Pulse 83   Temp (!) 97.2 F (36.2 C) (Temporal)   Ht 6' (1.829 m)   Wt 226 lb 12.8 oz (102.9 kg)   SpO2 98%   BMI 30.76 kg/m   GEN: Well nourished, well developed, in no acute distress  Neck: faint dermatitis noted Cardiac: RRR; no murmurs, rubs,  Respiratory:  normal respiratory rate and pattern with no distress - normal breath sounds with no rales, rhonchi, wheezes or rubs Skin: warm and dry, no rash  Psych: euthymic mood, appropriate affect and demeanor   Lab Results  Component Value Date   WBC 7.0 02/28/2022   HGB 12.8 02/28/2022   HCT 40.0 02/28/2022   PLT 347 02/28/2022   GLUCOSE 172 (H) 02/28/2022   CHOL 144 02/28/2022   TRIG 176 (H) 02/28/2022   HDL 67 02/28/2022   LDLCALC 48 02/28/2022   ALT 9 02/28/2022   AST 10 02/28/2022   NA 138 02/28/2022   K 3.9 02/28/2022   CL 100 02/28/2022   CREATININE 0.57 02/28/2022   BUN 10 02/28/2022   CO2 21 02/28/2022   TSH 1.510 02/28/2022   HGBA1C 7.4 (H) 02/28/2022   MICROALBUR negative 12/05/2019      Assessment & Plan:   Problem List Items Addressed This Visit       Cardiovascular and Mediastinum   Essential hypertension - Primary   Relevant Orders   CBC with Differential/Platelet   Comprehensive metabolic panel   TSH Continue meds      Endocrine   Type 2 diabetes mellitus without complication, without long-term current use of insulin (HCC)   Relevant Orders   CBC with Differential/Platelet   Comprehensive metabolic panel   Hemoglobin A1c   TSH Continue current med and watch diet     Other   Mixed hyperlipidemia   Relevant Orders   Lipid panel Continue crestor and fish oil Low fat / low chol diet  Dermatitis Rx triamcinolone cream   Other Visit Diagnoses      .  Meds ordered this encounter  Medications    triamcinolone cream (KENALOG) 0.1 %    Sig: Apply 1 Application topically 2 (two) times daily.    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  An After Visit Summary was printed  and given to the patient.  Yetta Flock [facility] [phone]

## 2022-08-24 ENCOUNTER — Other Ambulatory Visit: Payer: BC Managed Care – PPO

## 2022-08-25 ENCOUNTER — Other Ambulatory Visit: Payer: BC Managed Care – PPO

## 2022-08-25 DIAGNOSIS — I10 Essential (primary) hypertension: Secondary | ICD-10-CM

## 2022-08-25 DIAGNOSIS — E119 Type 2 diabetes mellitus without complications: Secondary | ICD-10-CM

## 2022-08-25 DIAGNOSIS — E782 Mixed hyperlipidemia: Secondary | ICD-10-CM

## 2022-08-26 LAB — CBC WITH DIFFERENTIAL/PLATELET
Basophils Absolute: 0 10*3/uL (ref 0.0–0.2)
Basos: 1 %
EOS (ABSOLUTE): 0.3 10*3/uL (ref 0.0–0.4)
Eos: 4 %
Hematocrit: 40.4 % (ref 34.0–46.6)
Hemoglobin: 12.8 g/dL (ref 11.1–15.9)
Immature Grans (Abs): 0 10*3/uL (ref 0.0–0.1)
Immature Granulocytes: 0 %
Lymphocytes Absolute: 1.7 10*3/uL (ref 0.7–3.1)
Lymphs: 28 %
MCH: 29.1 pg (ref 26.6–33.0)
MCHC: 31.7 g/dL (ref 31.5–35.7)
MCV: 92 fL (ref 79–97)
Monocytes Absolute: 0.3 10*3/uL (ref 0.1–0.9)
Monocytes: 6 %
Neutrophils Absolute: 3.7 10*3/uL (ref 1.4–7.0)
Neutrophils: 61 %
Platelets: 301 10*3/uL (ref 150–450)
RBC: 4.4 x10E6/uL (ref 3.77–5.28)
RDW: 12.8 % (ref 11.7–15.4)
WBC: 6 10*3/uL (ref 3.4–10.8)

## 2022-08-26 LAB — TSH: TSH: 1.75 u[IU]/mL (ref 0.450–4.500)

## 2022-08-26 LAB — COMPREHENSIVE METABOLIC PANEL
ALT: 11 IU/L (ref 0–32)
AST: 15 IU/L (ref 0–40)
Albumin/Globulin Ratio: 1.5 (ref 1.2–2.2)
Albumin: 3.8 g/dL — ABNORMAL LOW (ref 3.9–4.9)
Alkaline Phosphatase: 44 IU/L (ref 44–121)
BUN/Creatinine Ratio: 22 (ref 9–23)
BUN: 13 mg/dL (ref 6–24)
Bilirubin Total: 0.5 mg/dL (ref 0.0–1.2)
CO2: 20 mmol/L (ref 20–29)
Calcium: 8.9 mg/dL (ref 8.7–10.2)
Chloride: 100 mmol/L (ref 96–106)
Creatinine, Ser: 0.59 mg/dL (ref 0.57–1.00)
Globulin, Total: 2.5 g/dL (ref 1.5–4.5)
Glucose: 179 mg/dL — ABNORMAL HIGH (ref 70–99)
Potassium: 4.3 mmol/L (ref 3.5–5.2)
Sodium: 139 mmol/L (ref 134–144)
Total Protein: 6.3 g/dL (ref 6.0–8.5)
eGFR: 114 mL/min/{1.73_m2} (ref >59)

## 2022-08-26 LAB — LIPID PANEL
Chol/HDL Ratio: 2.9 ratio (ref 0.0–4.4)
Cholesterol, Total: 206 mg/dL — ABNORMAL HIGH (ref 100–199)
HDL: 72 mg/dL (ref >39)
LDL Chol Calc (NIH): 89 mg/dL (ref 0–99)
Triglycerides: 276 mg/dL — ABNORMAL HIGH (ref 0–149)
VLDL Cholesterol Cal: 45 mg/dL — ABNORMAL HIGH (ref 5–40)

## 2022-08-26 LAB — HEMOGLOBIN A1C
Est. average glucose Bld gHb Est-mCnc: 169 mg/dL
Hgb A1c MFr Bld: 7.5 % — ABNORMAL HIGH (ref 4.8–5.6)

## 2022-08-26 LAB — CARDIOVASCULAR RISK ASSESSMENT

## 2022-09-02 ENCOUNTER — Other Ambulatory Visit: Payer: Self-pay | Admitting: Physician Assistant

## 2022-09-02 MED ORDER — ATORVASTATIN CALCIUM 40 MG PO TABS: 40.0000 mg | ORAL_TABLET | Freq: Every day | ORAL | 1 refills | Status: DC

## 2022-09-04 ENCOUNTER — Other Ambulatory Visit: Payer: Self-pay | Admitting: Physician Assistant

## 2022-09-04 DIAGNOSIS — I10 Essential (primary) hypertension: Secondary | ICD-10-CM

## 2022-10-03 ENCOUNTER — Other Ambulatory Visit: Payer: Self-pay | Admitting: Physician Assistant

## 2022-10-03 DIAGNOSIS — E782 Mixed hyperlipidemia: Secondary | ICD-10-CM

## 2022-10-03 MED ORDER — ATORVASTATIN CALCIUM 40 MG PO TABS: 40.0000 mg | ORAL_TABLET | Freq: Every day | ORAL | 1 refills | Status: DC

## 2022-10-04 ENCOUNTER — Other Ambulatory Visit: Payer: Self-pay | Admitting: Physician Assistant

## 2022-10-04 DIAGNOSIS — E119 Type 2 diabetes mellitus without complications: Secondary | ICD-10-CM

## 2022-10-07 ENCOUNTER — Other Ambulatory Visit: Payer: Self-pay | Admitting: Physician Assistant

## 2022-10-31 ENCOUNTER — Other Ambulatory Visit: Payer: Self-pay | Admitting: Physician Assistant

## 2022-11-27 ENCOUNTER — Other Ambulatory Visit: Payer: Self-pay | Admitting: Physician Assistant

## 2022-11-27 DIAGNOSIS — E119 Type 2 diabetes mellitus without complications: Secondary | ICD-10-CM

## 2023-02-23 ENCOUNTER — Ambulatory Visit: Payer: BC Managed Care – PPO | Admitting: Physician Assistant

## 2023-03-05 ENCOUNTER — Other Ambulatory Visit: Payer: Self-pay | Admitting: Family Medicine

## 2023-03-05 ENCOUNTER — Other Ambulatory Visit: Payer: Self-pay | Admitting: Physician Assistant

## 2023-03-05 DIAGNOSIS — E119 Type 2 diabetes mellitus without complications: Secondary | ICD-10-CM

## 2023-03-05 DIAGNOSIS — I10 Essential (primary) hypertension: Secondary | ICD-10-CM

## 2023-03-23 ENCOUNTER — Ambulatory Visit: Payer: BC Managed Care – PPO | Admitting: Physician Assistant

## 2023-04-12 ENCOUNTER — Ambulatory Visit: Payer: BC Managed Care – PPO | Admitting: Physician Assistant

## 2023-04-12 ENCOUNTER — Encounter: Payer: Self-pay | Admitting: Physician Assistant

## 2023-04-12 VITALS — BP 120/68 | HR 96 | Temp 97.2°F | Ht 72.0 in | Wt 218.8 lb

## 2023-04-12 DIAGNOSIS — I10 Essential (primary) hypertension: Secondary | ICD-10-CM | POA: Diagnosis not present

## 2023-04-12 DIAGNOSIS — E119 Type 2 diabetes mellitus without complications: Secondary | ICD-10-CM

## 2023-04-12 DIAGNOSIS — E782 Mixed hyperlipidemia: Secondary | ICD-10-CM

## 2023-04-12 DIAGNOSIS — E559 Vitamin D deficiency, unspecified: Secondary | ICD-10-CM

## 2023-04-12 NOTE — Progress Notes (Signed)
Subjective:  Patient ID: Monique [name], female    DOB: April 19, 1976  Age: 47 y.o. MRN: [id number]  Chief Complaint  Patient presents with   Medical Management of Chronic Issues    Hypertension    Pt presents for follow up of hypertension. The patient is tolerating the medication well without side effects. Compliance with treatment has been good; including taking medication as directed , maintains a healthy diet and regular exercise regimen , and following up as directed. Currently taking zestoretic 10/12.5mg  qd  Mixed hyperlipidemia  Pt presents with hyperlipidemia. Compliance with treatment has been good - The patient is compliant with medications, maintains a low cholesterol diet , follows up as directed , and maintains an exercise regimen . The patient denies experiencing any hypercholesterolemia related symptoms. Currently taking fish oil bid and lipitor 40mg  qd  Pt with history of NIDDM - she is currently taking glucophage 1000mg  bid -- at this time she is not taking ozempic.  She had nausea with it and stopped it -she has appt to see eye doctor next month Pt is not checking glucose regularly   Current Outpatient Medications on File Prior to Visit  Medication Sig Dispense Refill   albuterol (VENTOLIN HFA) 108 (90 Base) MCG/ACT inhaler INHALE 1 - 2 PUFFS BY MOUTH EVERY 4 HOURS AS NEEDED 8.5 each 5   atorvastatin (LIPITOR) 40 MG tablet Take 1 tablet (40 mg total) by mouth daily. 90 tablet 1   dicyclomine (BENTYL) 10 MG capsule TAKE 1 CAPSULE BY MOUTH TWICE A DAY AS NEEDED FOR ABDOMNIMAL PAIN 180 capsule 2   EPINEPHrine 0.3 mg/0.3 mL IJ SOAJ injection Inject 0.3 mg into the muscle once.     levocetirizine (XYZAL) 5 MG tablet Take 5 mg by mouth every evening.     lisinopril-hydrochlorothiazide (ZESTORETIC) 10-12.5 MG tablet TAKE 1 TABLET BY MOUTH EVERY DAY 90 tablet 0   metFORMIN (GLUCOPHAGE) 1000 MG tablet TAKE 1 TABLET (1,000 MG TOTAL) BY MOUTH TWICE A DAY WITH FOOD 180 tablet 0    Omega-3 Fatty Acids (FISH OIL) 1000 MG CAPS Take by mouth 2 (two) times daily.     TRI-ESTARYLLA 0.18/0.215/0.25 MG-35 MCG tablet Take 1 tablet by mouth daily.     No current facility-administered medications on file prior to visit.   Past Medical History:  Diagnosis Date   Calculus of kidney    Essential (primary) hypertension    Irritable bowel syndrome with diarrhea    Mixed hyperlipidemia    Other microscopic hematuria    Right lower quadrant abdominal tenderness    Type 2 diabetes mellitus without complications (HCC)    History reviewed. No pertinent surgical history.  History reviewed. No pertinent family history. Social History   Socioeconomic History   Marital status: Married    Spouse name: Not on file   Number of children: Not on file   Years of education: Not on file   Highest education level: Not on file  Occupational History   Not on file  Tobacco Use   Smoking status: Never   Smokeless tobacco: Never  Vaping Use   Vaping status: Never Used  Substance and Sexual Activity   Alcohol use: Never   Drug use: Never   Sexual activity: Not on file  Other Topics Concern   Not on file  Social History Narrative   Not on file   Social Determinants of Health   Financial Resource Strain: Low Risk  (04/12/2023)   Overall Financial Resource Strain (CARDIA)  Difficulty of Paying Living Expenses: Not hard at all  Food Insecurity: No Food Insecurity (04/12/2023)   Hunger Vital Sign    Worried About Running Out of Food in the Last Year: Never true    Ran Out of Food in the Last Year: Never true  Transportation Needs: No Transportation Needs (04/12/2023)   PRAPARE - Administrator, Civil Service (Medical): No    Lack of Transportation (Non-Medical): No  Physical Activity: Sufficiently Active (04/12/2023)   Exercise Vital Sign    Days of Exercise per Week: 5 days    Minutes of Exercise per Session: 30 min  Stress: No Stress Concern Present (04/12/2023)    [employer] of Occupational Health - Occupational Stress Questionnaire    Feeling of Stress : Not at all  Social Connections: Not on file   CONSTITUTIONAL: Negative for chills, fatigue, fever, unintentional weight gain and unintentional weight loss.  E/N/T: Negative for ear pain, nasal congestion and sore throat.  CARDIOVASCULAR: Negative for chest pain, dizziness, palpitations and pedal edema.  RESPIRATORY: Negative for recent cough and dyspnea.  GASTROINTESTINAL: Negative for abdominal pain, acid reflux symptoms, constipation, diarrhea, nausea and vomiting.  MSK: Negative for arthralgias and myalgias.  INTEGUMENTARY: Negative for rash.  NEUROLOGICAL: Negative for dizziness and headaches.  PSYCHIATRIC: Negative for sleep disturbance and to question depression screen.  Negative for depression, negative for anhedonia.       Objective:  PHYSICAL EXAM:   VS: BP 120/68 (BP Location: Left Arm, Patient Position: Sitting, Cuff Size: Normal)   Pulse 96   Temp (!) 97.2 F (36.2 C) (Temporal)   Ht 6' (1.829 m)   Wt 218 lb 12.8 oz (99.2 kg)   LMP 04/09/2023 (Exact Date)   SpO2 98%   BMI 29.67 kg/m   GEN: Well nourished, well developed, in no acute distress  Cardiac: RRR; no murmurs, rubs, or gallops,no edema - Respiratory:  normal respiratory rate and pattern with no distress - normal breath sounds with no rales, rhonchi, wheezes or rubs MS: no deformity or atrophy  Skin: warm and dry, no rash  Neuro:  Alert and Oriented x 3, Strength and sensation are intact -  Psych: euthymic mood, appropriate affect and demeanor    Lab Results  Component Value Date   WBC 6.0 08/25/2022   HGB 12.8 08/25/2022   HCT 40.4 08/25/2022   PLT 301 08/25/2022   GLUCOSE 179 (H) 08/25/2022   CHOL 206 (H) 08/25/2022   TRIG 276 (H) 08/25/2022   HDL 72 08/25/2022   LDLCALC 89 08/25/2022   ALT 11 08/25/2022   AST 15 08/25/2022   NA 139 08/25/2022   K 4.3 08/25/2022   CL 100 08/25/2022    CREATININE 0.59 08/25/2022   BUN 13 08/25/2022   CO2 20 08/25/2022   TSH 1.750 08/25/2022   HGBA1C 7.5 (H) 08/25/2022   MICROALBUR negative 12/05/2019      Assessment & Plan:   Problem List Items Addressed This Visit       Cardiovascular and Mediastinum   Essential hypertension - Primary   Relevant Orders   CBC with Differential/Platelet   Comprehensive metabolic panel   TSH Continue meds      Endocrine   Type 2 diabetes mellitus without complication, without long-term current use of insulin (HCC)   Relevant Orders   CBC with Differential/Platelet   Comprehensive metabolic panel   Hemoglobin A1c   TSH Continue current med and watch diet     Other  Mixed hyperlipidemia   Relevant Orders   Lipid panel Continue crestor and fish oil Low fat / low chol diet     Other Visit Diagnoses      .  No orders of the defined types were placed in this encounter.    Orders Placed This Encounter  Procedures   Microalbumin / creatinine urine ratio   CBC with Differential/Platelet   Comprehensive metabolic panel   TSH   Hemoglobin A1c   Lipid panel     Follow-up: Return in about 6 months (around 10/11/2023) for chronic fasting follow-up.  An After Visit Summary was printed and given to the patient.  Jettie Pagan [facility] [phone]

## 2023-04-13 LAB — LIPID PANEL
Chol/HDL Ratio: 2.6 {ratio} (ref 0.0–4.4)
Cholesterol, Total: 190 mg/dL (ref 100–199)
HDL: 74 mg/dL (ref >39)
LDL Chol Calc (NIH): 79 mg/dL (ref 0–99)
Triglycerides: 225 mg/dL — ABNORMAL HIGH (ref 0–149)
VLDL Cholesterol Cal: 37 mg/dL (ref 5–40)

## 2023-04-13 LAB — COMPREHENSIVE METABOLIC PANEL
ALT: 13 [IU]/L (ref 0–32)
AST: 14 [IU]/L (ref 0–40)
Albumin: 4 g/dL (ref 3.9–4.9)
Alkaline Phosphatase: 57 [IU]/L (ref 44–121)
BUN/Creatinine Ratio: 23 (ref 9–23)
BUN: 12 mg/dL (ref 6–24)
Bilirubin Total: 0.6 mg/dL (ref 0.0–1.2)
CO2: 23 mmol/L (ref 20–29)
Calcium: 9.1 mg/dL (ref 8.7–10.2)
Chloride: 96 mmol/L (ref 96–106)
Creatinine, Ser: 0.52 mg/dL — ABNORMAL LOW (ref 0.57–1.00)
Globulin, Total: 2.9 g/dL (ref 1.5–4.5)
Glucose: 129 mg/dL — ABNORMAL HIGH (ref 70–99)
Potassium: 4 mmol/L (ref 3.5–5.2)
Sodium: 137 mmol/L (ref 134–144)
Total Protein: 6.9 g/dL (ref 6.0–8.5)
eGFR: 117 mL/min/{1.73_m2} (ref >59)

## 2023-04-13 LAB — CBC WITH DIFFERENTIAL/PLATELET
Basophils Absolute: 0.1 10*3/uL (ref 0.0–0.2)
Basos: 1 %
EOS (ABSOLUTE): 0.2 10*3/uL (ref 0.0–0.4)
Eos: 3 %
Hematocrit: 39.9 % (ref 34.0–46.6)
Hemoglobin: 12.8 g/dL (ref 11.1–15.9)
Immature Grans (Abs): 0 10*3/uL (ref 0.0–0.1)
Immature Granulocytes: 0 %
Lymphocytes Absolute: 2.3 10*3/uL (ref 0.7–3.1)
Lymphs: 30 %
MCH: 28.6 pg (ref 26.6–33.0)
MCHC: 32.1 g/dL (ref 31.5–35.7)
MCV: 89 fL (ref 79–97)
Monocytes Absolute: 0.4 10*3/uL (ref 0.1–0.9)
Monocytes: 5 %
Neutrophils Absolute: 4.6 10*3/uL (ref 1.4–7.0)
Neutrophils: 61 %
Platelets: 385 10*3/uL (ref 150–450)
RBC: 4.48 x10E6/uL (ref 3.77–5.28)
RDW: 12.9 % (ref 11.7–15.4)
WBC: 7.6 10*3/uL (ref 3.4–10.8)

## 2023-04-13 LAB — MICROALBUMIN / CREATININE URINE RATIO
Creatinine, Urine: 33.2 mg/dL
Microalb/Creat Ratio: 36 mg/g{creat} — ABNORMAL HIGH (ref 0–29)
Microalbumin, Urine: 11.8 ug/mL

## 2023-04-13 LAB — HEMOGLOBIN A1C
Est. average glucose Bld gHb Est-mCnc: 169 mg/dL
Hgb A1c MFr Bld: 7.5 % — ABNORMAL HIGH (ref 4.8–5.6)

## 2023-04-13 LAB — TSH: TSH: 1.31 u[IU]/mL (ref 0.450–4.500)

## 2023-04-14 ENCOUNTER — Encounter: Payer: Self-pay | Admitting: Physician Assistant

## 2023-04-17 ENCOUNTER — Other Ambulatory Visit: Payer: Self-pay | Admitting: Physician Assistant

## 2023-04-17 DIAGNOSIS — E119 Type 2 diabetes mellitus without complications: Secondary | ICD-10-CM

## 2023-04-17 MED ORDER — DAPAGLIFLOZIN PROPANEDIOL 5 MG PO TABS
5.0000 mg | ORAL_TABLET | Freq: Every day | ORAL | 2 refills | Status: DC
Start: 2023-04-17 — End: 2024-01-23

## 2023-05-24 LAB — HM DIABETES EYE EXAM

## 2023-05-31 ENCOUNTER — Other Ambulatory Visit: Payer: Self-pay | Admitting: Physician Assistant

## 2023-05-31 DIAGNOSIS — I10 Essential (primary) hypertension: Secondary | ICD-10-CM

## 2023-06-06 ENCOUNTER — Other Ambulatory Visit: Payer: Self-pay | Admitting: Physician Assistant

## 2023-07-03 ENCOUNTER — Other Ambulatory Visit: Payer: Self-pay

## 2023-07-03 DIAGNOSIS — E119 Type 2 diabetes mellitus without complications: Secondary | ICD-10-CM

## 2023-08-29 ENCOUNTER — Other Ambulatory Visit: Payer: Self-pay | Admitting: Physician Assistant

## 2023-08-29 DIAGNOSIS — I10 Essential (primary) hypertension: Secondary | ICD-10-CM

## 2023-09-03 ENCOUNTER — Other Ambulatory Visit: Payer: Self-pay | Admitting: Physician Assistant

## 2023-10-01 ENCOUNTER — Other Ambulatory Visit: Payer: Self-pay

## 2023-10-01 DIAGNOSIS — E119 Type 2 diabetes mellitus without complications: Secondary | ICD-10-CM

## 2023-10-02 ENCOUNTER — Other Ambulatory Visit: Payer: Self-pay | Admitting: Physician Assistant

## 2023-10-02 DIAGNOSIS — E119 Type 2 diabetes mellitus without complications: Secondary | ICD-10-CM

## 2023-10-02 MED ORDER — METFORMIN HCL 1000 MG PO TABS: 1000.0000 mg | ORAL_TABLET | Freq: Two times a day (BID) | ORAL | 0 refills | Status: DC

## 2023-10-11 ENCOUNTER — Ambulatory Visit: Payer: Self-pay | Admitting: Physician Assistant

## 2023-11-09 ENCOUNTER — Other Ambulatory Visit: Payer: Self-pay | Admitting: Physician Assistant

## 2023-11-14 ENCOUNTER — Ambulatory Visit: Payer: Self-pay | Admitting: Physician Assistant

## 2023-11-28 ENCOUNTER — Other Ambulatory Visit: Payer: Self-pay | Admitting: Physician Assistant

## 2023-11-28 DIAGNOSIS — I10 Essential (primary) hypertension: Secondary | ICD-10-CM

## 2023-11-29 ENCOUNTER — Other Ambulatory Visit: Payer: Self-pay | Admitting: Physician Assistant

## 2023-12-07 ENCOUNTER — Ambulatory Visit: Payer: Self-pay | Admitting: Physician Assistant

## 2023-12-12 ENCOUNTER — Other Ambulatory Visit: Payer: Self-pay | Admitting: Physician Assistant

## 2023-12-31 ENCOUNTER — Other Ambulatory Visit: Payer: Self-pay | Admitting: Physician Assistant

## 2023-12-31 DIAGNOSIS — E119 Type 2 diabetes mellitus without complications: Secondary | ICD-10-CM

## 2024-01-23 ENCOUNTER — Ambulatory Visit: Payer: Self-pay | Admitting: Physician Assistant

## 2024-01-23 ENCOUNTER — Encounter: Payer: Self-pay | Admitting: Physician Assistant

## 2024-01-23 VITALS — BP 116/60 | HR 84 | Temp 98.0°F | Ht 72.0 in | Wt 221.2 lb

## 2024-01-23 DIAGNOSIS — I10 Essential (primary) hypertension: Secondary | ICD-10-CM | POA: Diagnosis not present

## 2024-01-23 DIAGNOSIS — E119 Type 2 diabetes mellitus without complications: Secondary | ICD-10-CM | POA: Diagnosis not present

## 2024-01-23 DIAGNOSIS — E782 Mixed hyperlipidemia: Secondary | ICD-10-CM | POA: Diagnosis not present

## 2024-01-23 NOTE — Progress Notes (Signed)
 Subjective:  Patient ID: Monique [name], female    DOB: 04/23/1976  Age: 48 y.o. MRN: [id number]  Chief Complaint  Patient presents with   Medical Management of Chronic Issues    Hypertension    Pt presents for follow up of hypertension. The patient is tolerating the medication well without side effects. Compliance with treatment has been good; including taking medication as directed , maintains a healthy diet and regular exercise regimen , and following up as directed. Currently taking zestoretic  10/12.5mg  qd  Mixed hyperlipidemia  Pt presents with hyperlipidemia. Compliance with treatment has been good - The patient is compliant with medications, maintains a low cholesterol diet , follows up as directed , and maintains an exercise regimen . The patient denies experiencing any hypercholesterolemia related symptoms. Currently taking fish oil bid and lipitor 40mg  qd  Pt with history of NIDDM - she is currently taking glucophage  1000mg  bid -- she has been on farxiga  but it has caused problems with yeast infections and would like to stop medication - we will look at lab results and decide alternative treatment Pt is not checking glucose regularly   Current Outpatient Medications on File Prior to Visit  Medication Sig Dispense Refill   albuterol (VENTOLIN HFA) 108 (90 Base) MCG/ACT inhaler INHALE 1 - 2 PUFFS BY MOUTH EVERY 4 HOURS AS NEEDED 8.5 each 5   atorvastatin  (LIPITOR) 40 MG tablet TAKE 1 TABLET BY MOUTH EVERY DAY 90 tablet 0   dicyclomine (BENTYL) 10 MG capsule TAKE 1 CAPSULE BY MOUTH TWICE A DAY AS NEEDED FOR ABDOMNIMAL PAIN 180 capsule 2   EPINEPHrine 0.3 mg/0.3 mL IJ SOAJ injection Inject 0.3 mg into the muscle once.     levocetirizine (XYZAL) 5 MG tablet Take 5 mg by mouth every evening.     lisinopril -hydrochlorothiazide  (ZESTORETIC ) 10-12.5 MG tablet TAKE 1 TABLET BY MOUTH EVERY DAY 90 tablet 0   metFORMIN  (GLUCOPHAGE ) 1000 MG tablet TAKE 1 TABLET (1,000 MG TOTAL) BY MOUTH  TWICE A DAY WITH FOOD 180 tablet 0   Omega-3 Fatty Acids (FISH OIL) 1000 MG CAPS Take by mouth 2 (two) times daily.     TRI-ESTARYLLA 0.18/0.215/0.25 MG-35 MCG tablet Take 1 tablet by mouth daily.     No current facility-administered medications on file prior to visit.   Past Medical History:  Diagnosis Date   Calculus of kidney    Essential (primary) hypertension    Irritable bowel syndrome with diarrhea    Mixed hyperlipidemia    Other microscopic hematuria    Right lower quadrant abdominal tenderness    Type 2 diabetes mellitus without complications (HCC)    History reviewed. No pertinent surgical history.  History reviewed. No pertinent family history. Social History   Socioeconomic History   Marital status: Married    Spouse name: Not on file   Number of children: Not on file   Years of education: Not on file   Highest education level: 12th grade  Occupational History   Not on file  Tobacco Use   Smoking status: Never   Smokeless tobacco: Never  Vaping Use   Vaping status: Never Used  Substance and Sexual Activity   Alcohol use: Never   Drug use: Never   Sexual activity: Not on file  Other Topics Concern   Not on file  Social History Narrative   Not on file   Social Drivers of Health   Financial Resource Strain: Low Risk  (01/22/2024)   Overall Financial Resource Strain (  CARDIA)    Difficulty of Paying Living Expenses: Not hard at all  Food Insecurity: No Food Insecurity (01/22/2024)   Hunger Vital Sign    Worried About Running Out of Food in the Last Year: Never true    Ran Out of Food in the Last Year: Never true  Transportation Needs: No Transportation Needs (01/22/2024)   PRAPARE - Administrator, Civil Service (Medical): No    Lack of Transportation (Non-Medical): No  Physical Activity: Insufficiently Active (01/22/2024)   Exercise Vital Sign    Days of Exercise per Week: 3 days    Minutes of Exercise per Session: 20 min  Stress: No Stress  Concern Present (01/22/2024)   [employer] of Occupational Health - Occupational Stress Questionnaire    Feeling of Stress: Not at all  Social Connections: Socially Integrated (01/22/2024)   Social Connection and Isolation Panel    Frequency of Communication with Friends and Family: More than three times a week    Frequency of Social Gatherings with Friends and Family: Once a week    Attends Religious Services: More than 4 times per year    Active Member of [employer] or Organizations: Yes    Attends Engineer, structural: More than 4 times per year    Marital Status: Married   CONSTITUTIONAL: Negative for chills, fatigue, fever, unintentional weight gain and unintentional weight loss.  CARDIOVASCULAR: Negative for chest pain, dizziness, palpitations and pedal edema.  RESPIRATORY: Negative for recent cough and dyspnea.  GASTROINTESTINAL: Negative for abdominal pain, acid reflux symptoms, constipation, diarrhea, nausea and vomiting.   INTEGUMENTARY: Negative for rash.   PSYCHIATRIC: Negative for sleep disturbance and to question depression screen.  Negative for depression, negative for anhedonia.      Objective:  PHYSICAL EXAM:   VS: BP 116/60   Pulse 84   Temp 98 F (36.7 C)   Ht 6' (1.829 m)   Wt 221 lb 3.2 oz (100.3 kg)   SpO2 96%   BMI 30.00 kg/m   GEN: Well nourished, well developed, in no acute distress   Cardiac: RRR; no murmurs, rubs, or gallops,no edema -  Respiratory:  normal respiratory rate and pattern with no distress - normal breath sounds with no rales, rhonchi, wheezes or rubs  MS: no deformity or atrophy  Skin: warm and dry, no rash  Neuro:  Alert and Oriented x 3, - CN II-Xii grossly intact Psych: euthymic mood, appropriate affect and demeanor  Lab Results  Component Value Date   WBC 7.6 04/12/2023   HGB 12.8 04/12/2023   HCT 39.9 04/12/2023   PLT 385 04/12/2023   GLUCOSE 129 (H) 04/12/2023   CHOL 190 04/12/2023   TRIG 225 (H) 04/12/2023    HDL 74 04/12/2023   LDLCALC 79 04/12/2023   ALT 13 04/12/2023   AST 14 04/12/2023   NA 137 04/12/2023   K 4.0 04/12/2023   CL 96 04/12/2023   CREATININE 0.52 (L) 04/12/2023   BUN 12 04/12/2023   CO2 23 04/12/2023   TSH 1.310 04/12/2023   HGBA1C 7.5 (H) 04/12/2023   MICROALBUR negative 12/05/2019      Assessment & Plan:   Problem List Items Addressed This Visit       Cardiovascular and Mediastinum   Essential hypertension - Primary   Relevant Orders   CBC with Differential/Platelet   Comprehensive metabolic panel   TSH Continue meds      Endocrine   Type 2 diabetes mellitus without complication,  without long-term current use of insulin (HCC)   Relevant Orders   CBC with Differential/Platelet   Comprehensive metabolic panel   Hemoglobin A1c   TSH Continue current med and watch diet     Other   Mixed hyperlipidemia   Relevant Orders   Lipid panel Continue crestor and fish oil Low fat / low chol diet     Other Visit Diagnoses      .  No orders of the defined types were placed in this encounter.    Orders Placed This Encounter  Procedures   CBC with Differential/Platelet   Comprehensive metabolic panel with GFR   TSH   Lipid panel   Hemoglobin A1c     Follow-up: Return in about 6 months (around 07/25/2024) for chronic fasting follow-up.  An After Visit Summary was printed and given to the patient.  CAMIE JONELLE NICHOLAUS DEVONNA [facility] [phone]

## 2024-01-24 ENCOUNTER — Ambulatory Visit: Payer: Self-pay | Admitting: Physician Assistant

## 2024-01-24 LAB — COMPREHENSIVE METABOLIC PANEL WITH GFR
ALT: 9 IU/L (ref 0–32)
AST: 13 IU/L (ref 0–40)
Albumin: 3.7 g/dL — ABNORMAL LOW (ref 3.9–4.9)
Alkaline Phosphatase: 47 IU/L (ref 44–121)
BUN/Creatinine Ratio: 19 (ref 9–23)
BUN: 11 mg/dL (ref 6–24)
Bilirubin Total: 0.4 mg/dL (ref 0.0–1.2)
CO2: 20 mmol/L (ref 20–29)
Calcium: 8.9 mg/dL (ref 8.7–10.2)
Chloride: 100 mmol/L (ref 96–106)
Creatinine, Ser: 0.58 mg/dL (ref 0.57–1.00)
Globulin, Total: 2.5 g/dL (ref 1.5–4.5)
Glucose: 189 mg/dL — ABNORMAL HIGH (ref 70–99)
Potassium: 4.3 mmol/L (ref 3.5–5.2)
Sodium: 137 mmol/L (ref 134–144)
Total Protein: 6.2 g/dL (ref 6.0–8.5)
eGFR: 114 mL/min/1.73 (ref >59)

## 2024-01-24 LAB — CBC WITH DIFFERENTIAL/PLATELET
Basophils Absolute: 0 x10E3/uL (ref 0.0–0.2)
Basos: 1 %
EOS (ABSOLUTE): 0.2 x10E3/uL (ref 0.0–0.4)
Eos: 3 %
Hematocrit: 37.3 % (ref 34.0–46.6)
Hemoglobin: 11.5 g/dL (ref 11.1–15.9)
Immature Grans (Abs): 0 x10E3/uL (ref 0.0–0.1)
Immature Granulocytes: 0 %
Lymphocytes Absolute: 1.7 x10E3/uL (ref 0.7–3.1)
Lymphs: 28 %
MCH: 26.3 pg — ABNORMAL LOW (ref 26.6–33.0)
MCHC: 30.8 g/dL — ABNORMAL LOW (ref 31.5–35.7)
MCV: 85 fL (ref 79–97)
Monocytes Absolute: 0.4 x10E3/uL (ref 0.1–0.9)
Monocytes: 6 %
Neutrophils Absolute: 4 x10E3/uL (ref 1.4–7.0)
Neutrophils: 62 %
Platelets: 350 x10E3/uL (ref 150–450)
RBC: 4.37 x10E6/uL (ref 3.77–5.28)
RDW: 13.6 % (ref 11.7–15.4)
WBC: 6.3 x10E3/uL (ref 3.4–10.8)

## 2024-01-24 LAB — LIPID PANEL
Chol/HDL Ratio: 2.4 ratio (ref 0.0–4.4)
Cholesterol, Total: 151 mg/dL (ref 100–199)
HDL: 63 mg/dL (ref >39)
LDL Chol Calc (NIH): 50 mg/dL (ref 0–99)
Triglycerides: 246 mg/dL — ABNORMAL HIGH (ref 0–149)
VLDL Cholesterol Cal: 38 mg/dL (ref 5–40)

## 2024-01-24 LAB — HEMOGLOBIN A1C
Est. average glucose Bld gHb Est-mCnc: 177 mg/dL
Hgb A1c MFr Bld: 7.8 % — ABNORMAL HIGH (ref 4.8–5.6)

## 2024-01-24 LAB — TSH: TSH: 1.47 u[IU]/mL (ref 0.450–4.500)

## 2024-01-31 ENCOUNTER — Other Ambulatory Visit: Payer: Self-pay | Admitting: Physician Assistant

## 2024-01-31 DIAGNOSIS — E119 Type 2 diabetes mellitus without complications: Secondary | ICD-10-CM

## 2024-01-31 MED ORDER — EMPAGLIFLOZIN 10 MG PO TABS: 10.0000 mg | ORAL_TABLET | Freq: Every day | ORAL | 2 refills | Status: AC

## 2024-02-24 ENCOUNTER — Other Ambulatory Visit: Payer: Self-pay | Admitting: Physician Assistant

## 2024-02-24 DIAGNOSIS — I10 Essential (primary) hypertension: Secondary | ICD-10-CM

## 2024-03-16 ENCOUNTER — Other Ambulatory Visit: Payer: Self-pay | Admitting: Physician Assistant

## 2024-03-31 ENCOUNTER — Other Ambulatory Visit: Payer: Self-pay | Admitting: Physician Assistant

## 2024-03-31 DIAGNOSIS — E119 Type 2 diabetes mellitus without complications: Secondary | ICD-10-CM

## 2024-05-24 ENCOUNTER — Other Ambulatory Visit: Payer: Self-pay | Admitting: Physician Assistant

## 2024-05-24 DIAGNOSIS — I10 Essential (primary) hypertension: Secondary | ICD-10-CM

## 2024-05-26 ENCOUNTER — Other Ambulatory Visit: Payer: Self-pay | Admitting: Physician Assistant

## 2024-06-18 ENCOUNTER — Encounter: Payer: Self-pay | Admitting: Physician Assistant

## 2024-06-18 ENCOUNTER — Ambulatory Visit: Admitting: Physician Assistant

## 2024-06-18 VITALS — BP 120/62 | HR 89 | Temp 97.9°F | Resp 18 | Ht 72.0 in | Wt 218.0 lb

## 2024-06-18 DIAGNOSIS — L509 Urticaria, unspecified: Secondary | ICD-10-CM

## 2024-06-18 MED ORDER — PREDNISONE 20 MG PO TABS: ORAL_TABLET | ORAL | 0 refills | Status: AC

## 2024-06-18 MED ORDER — TRIAMCINOLONE ACETONIDE 40 MG/ML IJ SUSP: 60.0000 mg | Freq: Once | INTRAMUSCULAR | Status: AC

## 2024-06-18 NOTE — Progress Notes (Signed)
 Acute Office Visit  Subjective:    Patient ID: Monique [name], female    DOB: [birth year], 48 y.o.   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 and since cold weather her dogs have been in her house No other new allergens, soaps, makeup etc   Current Outpatient Medications:    albuterol (VENTOLIN HFA) 108 (90 Base) MCG/ACT inhaler, INHALE 1 - 2 PUFFS BY MOUTH EVERY 4 HOURS AS NEEDED, Disp: 8.5 each, Rfl: 5   atorvastatin  (LIPITOR) 40 MG tablet, TAKE 1 TABLET BY MOUTH EVERY DAY, Disp: 90 tablet, Rfl: 1   dicyclomine (BENTYL) 10 MG capsule, TAKE 1 CAPSULE BY MOUTH TWICE A DAY AS NEEDED FOR ABDOMNIMAL PAIN, Disp: 180 capsule, Rfl: 2   empagliflozin  (JARDIANCE ) 10 MG TABS tablet, Take 1 tablet (10 mg total) by mouth daily before breakfast., Disp: 30 tablet, Rfl: 2   EPINEPHrine 0.3 mg/0.3 mL IJ SOAJ injection, Inject 0.3 mg into the muscle once., Disp: , Rfl:    levocetirizine (XYZAL) 5 MG tablet, Take 5 mg by mouth every evening., Disp: , Rfl:    lisinopril -hydrochlorothiazide  (ZESTORETIC ) 10-12.5 MG tablet, TAKE 1 TABLET BY MOUTH EVERY DAY, Disp: 90 tablet, Rfl: 0   metFORMIN  (GLUCOPHAGE ) 1000 MG tablet, TAKE 1 TABLET (1,000 MG TOTAL) BY MOUTH TWICE A DAY WITH FOOD, Disp: 180 tablet, Rfl: 0   Omega-3 Fatty Acids (FISH OIL) 1000 MG CAPS, Take by mouth 2 (two) times daily., Disp: , Rfl:    predniSONE  (DELTASONE ) 20 MG tablet, 1 po tid for 3 days then 1 po bid for 3 days then 1 po qd for 3 days, Disp: 18 tablet, Rfl: 0   TRI-ESTARYLLA 0.18/0.215/0.25 MG-35 MCG tablet, Take 1 tablet by mouth daily., Disp: , Rfl:   Current  Facility-Administered Medications:    triamcinolone  acetonide (KENALOG -40) injection 60 mg, 60 mg, Intramuscular, Once,   Allergies  Allergen Reactions   Sulfa Antibiotics    Sulfamethoxazole Other (See Comments)    unknown    ROS CONSTITUTIONAL: Negative for chills, fatigue, fever,  E/N/T: Negative for ear pain, nasal congestion and sore throat.  CARDIOVASCULAR: Negative for chest pain,  RESPIRATORY: Negative for recent cough and dyspnea.  INTEGUMENTARY:see HPI      Objective:    PHYSICAL EXAM:   BP 120/62   Pulse 89   Temp 97.9 F (36.6 C) (Temporal)   Resp 18   Ht 6' (1.829 m)   Wt 218 lb (98.9 kg)   SpO2 97%   BMI 29.57 kg/m    GEN: Well nourished, well developed, in no acute distress  Cardiac: RRR; no murmurs, Respiratory:  normal respiratory rate and pattern with no distress - normal breath sounds with no rales, rhonchi, wheezes or rubs Skin: urticaria noted on pictures and pt with 2 small lesions now on side of face      Assessment & Plan:    Urticaria -     Triamcinolone  Acetonide 60 mg IM -     predniSONE ; 1 po tid for 3 days then 1 po bid for 3 days then  1 po qd for 3 days  Dispense: 18 tablet; Refill: 0 Continue allergy med and also take pepcid qd     Follow-up: Return if symptoms worsen or fail to improve.  An After Visit Summary was printed and given to the patient.  CAMIE JONELLE NICHOLAUS DEVONNA [facility] [phone]

## 2024-06-26 ENCOUNTER — Other Ambulatory Visit: Payer: Self-pay | Admitting: Physician Assistant

## 2024-06-26 DIAGNOSIS — E119 Type 2 diabetes mellitus without complications: Secondary | ICD-10-CM

## 2024-07-26 ENCOUNTER — Ambulatory Visit: Admitting: Physician Assistant

## 2024-08-20 ENCOUNTER — Ambulatory Visit: Admitting: Physician Assistant

## 2024-08-26 ENCOUNTER — Ambulatory Visit: Admitting: Physician Assistant
